# Patient Record
Sex: Male | Born: 1970 | Race: White | Hispanic: No | Marital: Married | Smoking: Never smoker
Health system: Southern US, Community
[De-identification: ages and names within clinical notes are randomized; demographics above are authoritative.]

## PROBLEM LIST (undated history)

## (undated) DIAGNOSIS — F4541 Pain disorder exclusively related to psychological factors: Secondary | ICD-10-CM

## (undated) DIAGNOSIS — Z7689 Persons encountering health services in other specified circumstances: Secondary | ICD-10-CM

## (undated) DIAGNOSIS — F419 Anxiety disorder, unspecified: Secondary | ICD-10-CM

## (undated) HISTORY — DX: Pain disorder exclusively related to psychological factors: F45.41

## (undated) HISTORY — DX: Persons encountering health services in other specified circumstances: Z76.89

## (undated) HISTORY — DX: Anxiety disorder, unspecified: F41.9

---

## 2003-12-18 HISTORY — PX: KNEE ARTHROSCOPY: SHX127

## 2004-12-17 HISTORY — PX: LUNG SURGERY: SHX703

## 2005-03-08 ENCOUNTER — Emergency Department (HOSPITAL_COMMUNITY): Admission: EM | Admit: 2005-03-08 | Discharge: 2005-03-08 | Payer: Self-pay | Admitting: *Deleted

## 2007-02-08 ENCOUNTER — Emergency Department (HOSPITAL_COMMUNITY): Admission: EM | Admit: 2007-02-08 | Discharge: 2007-02-08 | Payer: Self-pay | Admitting: Emergency Medicine

## 2007-12-05 ENCOUNTER — Ambulatory Visit: Payer: Self-pay | Admitting: Family Medicine

## 2007-12-05 DIAGNOSIS — A09 Infectious gastroenteritis and colitis, unspecified: Secondary | ICD-10-CM | POA: Insufficient documentation

## 2007-12-08 LAB — CONVERTED CEMR LAB
ALT: 53 units/L (ref 0–53)
AST: 27 units/L (ref 0–37)
CO2: 21 meq/L (ref 19–32)
Chloride: 107 meq/L (ref 96–112)
Creatinine, Ser: 0.9 mg/dL (ref 0.40–1.50)
Hemoglobin: 15.5 g/dL (ref 13.0–17.0)
Lymphocytes Relative: 32 % (ref 12–46)
MCHC: 34.8 g/dL (ref 30.0–36.0)
Monocytes Absolute: 0.7 10*3/uL (ref 0.1–1.0)
Monocytes Relative: 12 % (ref 3–12)
Neutro Abs: 2.9 10*3/uL (ref 1.7–7.7)
RBC: 4.91 M/uL (ref 4.22–5.81)
Total Bilirubin: 0.7 mg/dL (ref 0.3–1.2)
Total Protein: 7.7 g/dL (ref 6.0–8.3)

## 2008-02-04 ENCOUNTER — Ambulatory Visit: Payer: Self-pay | Admitting: Family Medicine

## 2008-02-25 ENCOUNTER — Telehealth (INDEPENDENT_AMBULATORY_CARE_PROVIDER_SITE_OTHER): Payer: Self-pay | Admitting: *Deleted

## 2008-02-26 ENCOUNTER — Encounter: Payer: Self-pay | Admitting: Family Medicine

## 2008-02-26 LAB — CONVERTED CEMR LAB
AST: 48 units/L
Albumin: 4.1 g/dL
Alkaline Phosphatase: 52 units/L
CO2: 26 meq/L
Calcium: 9 mg/dL
Creatinine, Ser: 0.9 mg/dL
Glucose, Bld: 96 mg/dL
HCT: 41.3 %
Hemoglobin: 14.8 g/dL

## 2008-02-27 ENCOUNTER — Ambulatory Visit: Payer: Self-pay | Admitting: Family Medicine

## 2008-02-27 DIAGNOSIS — L0293 Carbuncle, unspecified: Secondary | ICD-10-CM

## 2008-02-27 DIAGNOSIS — B957 Other staphylococcus as the cause of diseases classified elsewhere: Secondary | ICD-10-CM

## 2008-02-27 DIAGNOSIS — L0292 Furuncle, unspecified: Secondary | ICD-10-CM | POA: Insufficient documentation

## 2008-03-03 ENCOUNTER — Encounter: Payer: Self-pay | Admitting: Family Medicine

## 2008-03-05 ENCOUNTER — Telehealth: Payer: Self-pay | Admitting: Family Medicine

## 2008-05-13 ENCOUNTER — Telehealth: Payer: Self-pay | Admitting: Family Medicine

## 2012-04-21 ENCOUNTER — Ambulatory Visit (INDEPENDENT_AMBULATORY_CARE_PROVIDER_SITE_OTHER): Payer: BC Managed Care – PPO | Admitting: Physician Assistant

## 2012-04-21 ENCOUNTER — Encounter: Payer: Self-pay | Admitting: Physician Assistant

## 2012-04-21 VITALS — BP 128/83 | HR 90 | Ht 73.0 in | Wt 241.0 lb

## 2012-04-21 DIAGNOSIS — F3289 Other specified depressive episodes: Secondary | ICD-10-CM

## 2012-04-21 DIAGNOSIS — F329 Major depressive disorder, single episode, unspecified: Secondary | ICD-10-CM

## 2012-04-21 DIAGNOSIS — F4010 Social phobia, unspecified: Secondary | ICD-10-CM

## 2012-04-21 DIAGNOSIS — M542 Cervicalgia: Secondary | ICD-10-CM

## 2012-04-21 DIAGNOSIS — F32A Depression, unspecified: Secondary | ICD-10-CM

## 2012-04-21 DIAGNOSIS — F411 Generalized anxiety disorder: Secondary | ICD-10-CM

## 2012-04-21 MED ORDER — ALPRAZOLAM 0.5 MG PO TABS: 0.5000 mg | ORAL_TABLET | Freq: Two times a day (BID) | ORAL | Status: AC | PRN

## 2012-04-21 MED ORDER — CLONAZEPAM 0.5 MG PO TABS: 0.5000 mg | ORAL_TABLET | Freq: Two times a day (BID) | ORAL | Status: DC | PRN

## 2012-04-21 MED ORDER — CITALOPRAM HYDROBROMIDE 20 MG PO TABS: 20.0000 mg | ORAL_TABLET | Freq: Every day | ORAL | Status: DC

## 2012-04-21 NOTE — Progress Notes (Signed)
  Subjective:    Patient ID: Dale Roberts, male    DOB: 1971/05/05, 41 y.o.   MRN: [id number]  HPI Patient presents to the clinic today to establish care. PMH was reviewed. Patient is currently not on any medications. For the past 8 months patient has started to become increasingly anxious. He works 60+ hours a week, doesn't get to see his family a lot, and has a lot of trouble sleeping. He notice that crowds have made him not want to come out of his home. It took him 10 minutes to convince himself to come into office today. Sometimes when people are talking or trying to shake his hands he sees germs. He currently has a job where he has to deal with people and he finds it very hard. He is in school but will not graduate for a while. He has tried melatonin for sleep.   His neck has hurt for the past 4 months. He doesn't remember and injury. He has not tried anything to make it better. He holds a lot of stress in the upper back and shoulders.      Review of Systems     Objective:   Physical Exam  Constitutional: He is oriented to person, place, and time. He appears well-developed and well-nourished.  HENT:  Head: Normocephalic and atraumatic.  Neck: Normal range of motion. Neck supple.       NO bony tenderness. Some mild tenderness with palpation of trapizues.Strength 5/5.   Cardiovascular: Normal rate, regular rhythm, normal heart sounds and intact distal pulses.   Pulmonary/Chest: Effort normal. He has no wheezes.  Lymphadenopathy:    He has no cervical adenopathy.  Neurological: He is alert and oriented to person, place, and time.  Skin: Skin is warm and dry.  Psychiatric:       Anxious and very talkative.          Assessment & Plan:  GAD/Social anxiety/Panic attacks/Depression-PHQ-9 was 20 and GAD-7 was 16. Start celexa 20mg  1/2 tab for 7 days then increase to 1 tab at night. Will given xanax to use when having a panic attack up to twice a day. Continue exercise because that  naturally increases your feel good hormones. Recheck in 6 weeks. Make as much time with family as can. I know job is of much stress try to do things at work that help eliminate stress.  Neck pain- Offered to do x-rays today.Patient declined. Advised to start Ibuprofen for pain and stiffness. Consider massage therapy. Ice to help with pain. Could consider PT to strengthen neck muscles.

## 2012-04-21 NOTE — Patient Instructions (Addendum)
Start celexa 20mg  1/2 tab for 7 days then increase to 1 tab at night. Will given xanax to use when having a panic attack up to twice a day. Continue exercise because that naturally increases your feel good hormones. Recheck in 6 weeks. Make as much time with family as can.   Anxiety and Panic Attacks Your caregiver has informed you that you are having an anxiety or panic attack. There may be many forms of this. Most of the time these attacks come suddenly and without warning. They come at any time of day, including periods of sleep, and at any time of life. They may be strong and unexplained. Although panic attacks are very scary, they are physically harmless. Sometimes the cause of your anxiety is not known. Anxiety is a protective mechanism of the body in its fight or flight mechanism. Most of these perceived danger situations are actually nonphysical situations (such as anxiety over losing a job). CAUSES  The causes of an anxiety or panic attack are many. Panic attacks may occur in otherwise healthy people given a certain set of circumstances. There may be a genetic cause for panic attacks. Some medications may also have anxiety as a side effect. SYMPTOMS  Some of the most common feelings are:  Intense terror.   Dizziness, feeling faint.   Hot and cold flashes.   Fear of going crazy.   Feelings that nothing is real.   Sweating.   Shaking.   Chest pain or a fast heartbeat (palpitations).   Smothering, choking sensations.   Feelings of impending doom and that death is near.   Tingling of extremities, this may be from over-breathing.   Altered reality (derealization).   Being detached from yourself (depersonalization).  Several symptoms can be present to make up anxiety or panic attacks. DIAGNOSIS  The evaluation by your caregiver will depend on the type of symptoms you are experiencing. The diagnosis of anxiety or panic attack is made when no physical illness can be determined to  be a cause of the symptoms. TREATMENT  Treatment to prevent anxiety and panic attacks may include:  Avoidance of circumstances that cause anxiety.   Reassurance and relaxation.   Regular exercise.   Relaxation therapies, such as yoga.   Psychotherapy with a psychiatrist or therapist.   Avoidance of caffeine, alcohol and illegal drugs.   Prescribed medication.  SEEK IMMEDIATE MEDICAL CARE IF:   You experience panic attack symptoms that are different than your usual symptoms.   You have any worsening or concerning symptoms.  Document Released: 12/03/2005 Document Revised: 11/22/2011 Document Reviewed: 04/06/2010 [facility] Patient Information 2012 [place], [place].

## 2012-07-21 ENCOUNTER — Encounter: Payer: Self-pay | Admitting: Physician Assistant

## 2012-07-21 ENCOUNTER — Ambulatory Visit (INDEPENDENT_AMBULATORY_CARE_PROVIDER_SITE_OTHER): Payer: BC Managed Care – PPO | Admitting: Physician Assistant

## 2012-07-21 VITALS — BP 124/87 | HR 85 | Ht 73.0 in | Wt 241.0 lb

## 2012-07-21 DIAGNOSIS — F410 Panic disorder [episodic paroxysmal anxiety] without agoraphobia: Secondary | ICD-10-CM

## 2012-07-21 DIAGNOSIS — F411 Generalized anxiety disorder: Secondary | ICD-10-CM

## 2012-07-21 MED ORDER — ALPRAZOLAM 1 MG PO TABS: 1.0000 mg | ORAL_TABLET | Freq: Every evening | ORAL | Status: AC | PRN

## 2012-07-21 NOTE — Progress Notes (Signed)
  Subjective:    Patient ID: Dale Roberts, male    DOB: 1971-09-17, 41 y.o.   MRN: [id number]  HPI Patient presents to the clinic for 6 week follow up on anxiety/panic attacks. He did not start celexa because he looked at the side effect profile and did not "want to mess with brain chemistry". He has been using the xanax as needed for "attacks". He states that he uses about 2-3 times a week especially to go to sleep. He continues to be exteremly anxious. He still is working 60+ hours a week. He does think a job change will occur in October sometime. He is going to try to start exercising and has started eating a more healthy diet that is balanced. Denies any CP, palpitations, or SOB.   Review of Systems     Objective:   Physical Exam  Constitutional: He is oriented to person, place, and time. He appears well-developed and well-nourished.  HENT:  Head: Normocephalic and atraumatic.  Cardiovascular: Normal rate, regular rhythm and normal heart sounds.   Pulmonary/Chest: Effort normal and breath sounds normal.  Neurological: He is alert and oriented to person, place, and time.  Skin: Skin is warm and dry.  Psychiatric:       Anxious and talked a lot.           Assessment & Plan:  GAD/ Panic attacks- Discussed in depth about importance of treating overall anxiety and not just "attacks". I enouraged him that if we could manage overall anxiety we might be able to decrease attacks. Pt did not want to use anything other than Xanax for acute attacks. We discussed addictive nature of Xanax and how this should not be used as a crutch for all situations that increase anxiety but only for attacks and no more than 2-3 times a week. I do think that coping skills could help patient. I Encouraged him to go to counseling and learn some behavioral ways to deal with anxiety. He stated he did not have time. Encouraged him to make time for exercise because that would naturally increase his good hormone  levels. REfilled xanax.  Pt does need CPE. Encouraged him to schedule CPE in next month or so and to come fasting.

## 2012-07-21 NOTE — Patient Instructions (Addendum)
Follow up in 1-2 months for CPE. Use xanax as needed no more than once a day.

## 2012-10-10 ENCOUNTER — Ambulatory Visit (INDEPENDENT_AMBULATORY_CARE_PROVIDER_SITE_OTHER): Payer: BC Managed Care – PPO | Admitting: Family Medicine

## 2012-10-10 ENCOUNTER — Encounter: Payer: Self-pay | Admitting: Family Medicine

## 2012-10-10 VITALS — BP 140/96 | HR 75 | Temp 97.9°F | Wt 242.0 lb

## 2012-10-10 DIAGNOSIS — N451 Epididymitis: Secondary | ICD-10-CM | POA: Insufficient documentation

## 2012-10-10 DIAGNOSIS — N509 Disorder of male genital organs, unspecified: Secondary | ICD-10-CM

## 2012-10-10 DIAGNOSIS — N453 Epididymo-orchitis: Secondary | ICD-10-CM

## 2012-10-10 DIAGNOSIS — N50819 Testicular pain, unspecified: Secondary | ICD-10-CM | POA: Insufficient documentation

## 2012-10-10 LAB — POCT URINALYSIS DIPSTICK
Blood, UA: NEGATIVE
Glucose, UA: NEGATIVE
Nitrite, UA: NEGATIVE
Protein, UA: NEGATIVE
Urobilinogen, UA: 0.2
pH, UA: 7.5

## 2012-10-10 MED ORDER — DOXYCYCLINE HYCLATE 100 MG PO TABS: ORAL_TABLET | ORAL | Status: DC

## 2012-10-10 MED ORDER — LIDOCAINE HCL 1 % IJ SOLN: 250.0000 mg | Freq: Every day | INTRAMUSCULAR | Status: DC

## 2012-10-10 MED ORDER — CIPROFLOXACIN HCL 500 MG PO TABS: 500.0000 mg | ORAL_TABLET | Freq: Two times a day (BID) | ORAL | Status: AC

## 2012-10-10 NOTE — Patient Instructions (Signed)
Epididymitis Epididymitis is a swelling (inflammation) of the epididymis. The epididymis is a cord-like structure along the back part of the testicle. Epididymitis is usually, but not always, caused by infection. This is usually a sudden problem beginning with chills, fever and pain behind the scrotum and in the testicle. There may be swelling and redness of the testicle. DIAGNOSIS  Physical examination will reveal a tender, swollen epididymis. Sometimes, cultures are obtained from the urine or from prostate secretions to help find out if there is an infection or if the cause is a different problem. Sometimes, blood work is performed to see if your white blood cell count is elevated and if a germ (bacterial) or viral infection is present. Using this knowledge, an appropriate medicine which kills germs (antibiotic) can be chosen by your caregiver. A viral infection causing epididymitis will most often go away (resolve) without treatment. HOME CARE INSTRUCTIONS   Hot sitz baths for 20 minutes, 4 times per day, may help relieve pain.  Only take over-the-counter or prescription medicines for pain, discomfort or fever as directed by your caregiver.  Take all medicines, including antibiotics, as directed. Take the antibiotics for the full prescribed length of time even if you are feeling better.  It is very important to keep all follow-up appointments. SEEK IMMEDIATE MEDICAL CARE IF:   You have a fever.  You have pain not relieved with medicines.  You have any worsening of your problems.  Your pain seems to come and go.  You develop pain, redness, and swelling in the scrotum and surrounding areas. MAKE SURE YOU:   Understand these instructions.  Will watch your condition.  Will get help right away if you are not doing well or get worse. Document Released: 11/30/2000 Document Revised: 02/25/2012 Document Reviewed: 10/20/2009 [employer] Patient Information 2013 [employer].  

## 2012-10-10 NOTE — Progress Notes (Signed)
CC: Dale Roberts is a 41 y.o. male is here for Testicle Pain   Subjective: HPI:  Patient describes 2-3 weeks of a waxing and waning "tightness" in his right testicle it is not influenced by position or activities. He grew concerned there is a past day severity has intensified and is a small degree of radiation proximally into the groin. The discomfort seems to come and go about every 30-60 minutes, it can be 10 out of 10 pain but also will have periods of complete resolution of pain. When the pain comes on these past 24 hours and associated with nausea. He's never had this before. There's been no interventions as of yet. He denies any recent trauma to the testicle. He denies any swelling or architectural changes to the naked eye. He denies any penile discharge or lesions. He states it does feel somewhat odd when urinating but he has trouble describing this. He is sexually active with his wife and does not believe that she has any sexual transmitted infections. He denies fevers, abdominal pain, GI disturbance, bloody stools, Kerlix stools, history of hernias, history of urologic surgeries, pain in the rectum, nor pain exacerbated by sitting.   Review Of Systems Outlined In HPI  Past Medical History  Diagnosis Date  . Anxiety   . Stress headaches   . Sleep concern      No family history on file.   History  Substance Use Topics  . Smoking status: Never Smoker   . Smokeless tobacco: Not on file  . Alcohol Use: 2.5 oz/week    5 drink(s) per week     Objective: Filed Vitals:   10/10/12 0908  BP: 140/96  Pulse: 75  Temp: 97.9 F (36.6 C)    General: Alert and Oriented, No Acute Distress Lungs:Comfortable work of breathing. Cardiac: Regular rate and rhythm. Abdomen: Soft nontender, no inguinal hernia GU: Penis has a grossly normal appearance without lesions, there is no apparent swelling either testicle, the right testicle is exquisitely tender in the posterior aspect, his pain is  localized to the epididymis, cremasteric reflexes present and there is no pain with palpation of the spermatic cord or surrounding vessels. The testicle has a vertical lie  Assessment & Plan: [name] was seen today for testicle pain.  Diagnoses and associated orders for this visit:  Testicular pain - POCT urinalysis dipstick - GC/chlamydia probe amp, urine - Urine culture  Epididymitis, right - cefTRIAXone (ROCEPHIN) 250 mg in lidocaine (XYLOCAINE) 1 % IM only syringe; Inject 1 mL (250 mg total) into the muscle daily at 6 (six) AM. - ciprofloxacin (CIPRO) 500 MG tablet; Take 1 tablet (500 mg total) by mouth 2 (two) times daily. - doxycycline (VIBRA-TABS) 100 MG tablet; One by mouth twice a day for ten days.    Suspect epididymitis, we'll treat empirically for gonorrhea and Chlamydia testing sent out for confirmation and for counseling of his partner, given his age we will also cover possible enteric organisms with Cipro. Urinalysis is unremarkable, culture has been sent.Signs and symptoms requring emergent/urgent reevaluation were discussed with the patient. I've asked him to avoid sexual intercourse until results are back.   Return if symptoms worsen or fail to improve.

## 2012-10-13 ENCOUNTER — Other Ambulatory Visit: Payer: Self-pay | Admitting: Physician Assistant

## 2012-10-13 ENCOUNTER — Other Ambulatory Visit: Payer: BC Managed Care – PPO

## 2012-10-13 ENCOUNTER — Encounter: Payer: Self-pay | Admitting: Family Medicine

## 2012-10-13 ENCOUNTER — Other Ambulatory Visit: Payer: Self-pay | Admitting: Family Medicine

## 2012-10-13 ENCOUNTER — Ambulatory Visit (HOSPITAL_BASED_OUTPATIENT_CLINIC_OR_DEPARTMENT_OTHER)
Admission: RE | Admit: 2012-10-13 | Discharge: 2012-10-13 | Disposition: A | Discharge status: Home / Self Care | Payer: BC Managed Care – PPO | Source: Ambulatory Visit | Attending: Family Medicine | Admitting: Family Medicine

## 2012-10-13 ENCOUNTER — Ambulatory Visit (INDEPENDENT_AMBULATORY_CARE_PROVIDER_SITE_OTHER): Payer: BC Managed Care – PPO | Admitting: Family Medicine

## 2012-10-13 ENCOUNTER — Other Ambulatory Visit (HOSPITAL_BASED_OUTPATIENT_CLINIC_OR_DEPARTMENT_OTHER): Payer: BC Managed Care – PPO

## 2012-10-13 ENCOUNTER — Telehealth: Payer: Self-pay | Admitting: Family Medicine

## 2012-10-13 VITALS — BP 121/87 | HR 84 | Temp 97.8°F | Ht 72.0 in | Wt 241.0 lb

## 2012-10-13 DIAGNOSIS — N50811 Right testicular pain: Secondary | ICD-10-CM

## 2012-10-13 DIAGNOSIS — N509 Disorder of male genital organs, unspecified: Secondary | ICD-10-CM

## 2012-10-13 DIAGNOSIS — R319 Hematuria, unspecified: Secondary | ICD-10-CM

## 2012-10-13 DIAGNOSIS — R079 Chest pain, unspecified: Secondary | ICD-10-CM

## 2012-10-13 DIAGNOSIS — N50819 Testicular pain, unspecified: Secondary | ICD-10-CM

## 2012-10-13 DIAGNOSIS — N433 Hydrocele, unspecified: Secondary | ICD-10-CM | POA: Insufficient documentation

## 2012-10-13 DIAGNOSIS — R071 Chest pain on breathing: Secondary | ICD-10-CM | POA: Insufficient documentation

## 2012-10-13 DIAGNOSIS — K5900 Constipation, unspecified: Secondary | ICD-10-CM | POA: Insufficient documentation

## 2012-10-13 LAB — COMPLETE METABOLIC PANEL WITH GFR
ALT: 65 U/L — ABNORMAL HIGH (ref 0–53)
AST: 39 U/L — ABNORMAL HIGH (ref 0–37)
Albumin: 4.9 g/dL (ref 3.5–5.2)
Alkaline Phosphatase: 50 U/L (ref 39–117)
BUN: 14 mg/dL (ref 6–23)
CO2: 27 mEq/L (ref 19–32)
Calcium: 9.6 mg/dL (ref 8.4–10.5)
Chloride: 103 mEq/L (ref 96–112)
Creat: 0.96 mg/dL (ref 0.50–1.35)
GFR, Est African American: >89 mL/min
GFR, Est Non African American: >89 mL/min
Glucose, Bld: 117 mg/dL — ABNORMAL HIGH (ref 70–99)
Potassium: 4.2 mEq/L (ref 3.5–5.3)
Sodium: 139 mEq/L (ref 135–145)
Total Bilirubin: 0.6 mg/dL (ref 0.3–1.2)
Total Protein: 7.5 g/dL (ref 6.0–8.3)

## 2012-10-13 LAB — POCT URINALYSIS DIPSTICK
Bilirubin, UA: NEGATIVE
Glucose, UA: NEGATIVE
Ketones, UA: NEGATIVE
Leukocytes, UA: NEGATIVE
Nitrite, UA: NEGATIVE
Protein, UA: NEGATIVE
Spec Grav, UA: 1.025
Urobilinogen, UA: 0.2
pH, UA: 5.5

## 2012-10-13 LAB — CBC WITH DIFFERENTIAL/PLATELET
Basophils Absolute: 0 10*3/uL (ref 0.0–0.1)
Eosinophils Relative: 2 % (ref 0–5)
Hemoglobin: 15 g/dL (ref 13.0–17.0)
Lymphocytes Relative: 33 % (ref 12–46)
Lymphs Abs: 1.6 10*3/uL (ref 0.7–4.0)
MCH: 31 pg (ref 26.0–34.0)
Neutrophils Relative %: 57 % (ref 43–77)
RBC: 4.84 MIL/uL (ref 4.22–5.81)

## 2012-10-13 MED ORDER — ALPRAZOLAM 1 MG PO TABS: 1.0000 mg | ORAL_TABLET | Freq: Every evening | ORAL | Status: DC | PRN

## 2012-10-13 NOTE — Progress Notes (Signed)
CC: Dale Roberts is a 41 y.o. male is here for Testicle Pain   Subjective: HPI:  Patient returns with worsening right testicle pain. Is now associated with episodes of confusion, nausea, right abdominal pain, suprapubic pain, and posterior lateral pain in his chest wall. He complains of subjective fevers and chills. He states after receiving Rocephin and starting Cipro and doxycycline he felt somewhat better Friday afternoon/evening but pretty much laid in bed for the majority of Saturday and Sunday. Urinalysis on Friday was unremarkable and I'm still waiting for urine culture and gonorrhea/Chlamydia. Patient denies any change in odor color or consistency of his urine, and denies any change in his bowel habits he continues to have one pass bowel movement a day without blood or tar-like appearance. His discomfort is described as a pressure and grip like sensation in his right testicle that radiates up in the abdomen typically 10 out of 10 in severity the often receives to 0/10 without any particular intervention, these episodes last about half an hour and come on every hour or so. He denies any testicular swelling, change in the skin was testicles, penile discharge, penile pain, flank pain, blood in the urine. He is concerned about his posterior lateral chest wall pain because it feels identical to that when he had an episode of what sounds like a MRSA empyema in his left lung back in 2008.    Review Of Systems Outlined In HPI  Past Medical History  Diagnosis Date  . Anxiety   . Stress headaches   . Sleep concern      No family history on file.   History  Substance Use Topics  . Smoking status: Never Smoker   . Smokeless tobacco: Not on file  . Alcohol Use: 2.5 oz/week    5 drink(s) per week     Objective: Filed Vitals:   10/13/12 1123  BP: 121/87  Pulse: 84  Temp: 97.8 F (36.6 C)    General: Alert and Oriented, No Acute Distress HEENT: Pupils equal, round, reactive to  light. Conjunctivae clear.  Moist mucous membranes, pharynx without inflammation nor lesions.  Lungs: Clear to auscultation bilaterally, no wheezing/ronchi/rales.  Comfortable work of breathing.  decreased breath sounds on the left side  Back: No CVA tenderness to percussion Cardiac: Regular rate and rhythm. Normal S1/S2.  No murmurs, rubs, nor gallops.   Abdomen: Normal bowel sounds, soft and non tender without palpable masses.No right lower quadrant tenderness Genitourinary: Unremarkable penis, left testicle is nontender, right testicle is tender on the posterior aspect, is lying in a vertical orientation, cremasteric reflexes present, no inguinal hernia palpated, Skin: Warm and dry.  Assessment & Plan: Dale Roberts was seen today for testicle pain.  Diagnoses and associated orders for this visit:  Testicular pain - US Scrotum; Future - CBC with Differential - Lactic acid, plasma - POCT urinalysis dipstick  Chest pain - DG Chest 2 View; Future - COMPLETE METABOLIC PANEL WITH GFR     Ultrasound of the scrotum this afternoon to assess for blood flow and rule in or out possible infection. Due to trace blood and urine I can to get a CT scan of abdomen and pelvis to rule out nephrolithiasis. CBC the CBC is a white count, lactic acid to rule out bowel ischemia, complete metabolic panel to rule out renal dysfunction or hepatic dysfunction. Ultimate plan will be discussed with the patient once results are available.

## 2012-10-13 NOTE — Telephone Encounter (Signed)
Sue Lush, Will you please let mr bruss know that his chest xray, ct scan, and ultrasound were all normal. The chest xray is reported as unchanged dating back to a 2008 film. I'm waiting for all the blood work to return but there does not appear to be any active infection based on what's back already.  I'd like him to continue taking the cipro and doxycycline though.  I'm going to schedule him an urgent urology appointment, but if he's feeling worse overnight or into tomorrow I'd like him to go to an emergency room since I know he's already in quite a bit of discomfort.  I've placed the Xanax rx he requested on your desk, can you please send it in to whatever pharmacy he'd like.

## 2012-10-13 NOTE — Telephone Encounter (Signed)
Pt walk-in advised that he has not been able to sleep and needs something called in today please. He saw another Dr. Here in our office and in his chart he was prescribed something for sleep and it has been a little while since been filled and req to have that particular med filled and sent to [facility] in [place]. Thanks!

## 2012-10-13 NOTE — Telephone Encounter (Signed)
noted 

## 2012-10-13 NOTE — Telephone Encounter (Signed)
Pt notified and rx called in; pt canceled appointment with urology for tomorrow. He states he cannot miss anymore days of work. Pt understands to go to ER if sxs worsen

## 2012-10-15 LAB — GC/CHLAMYDIA PROBE AMP, URINE: GC Probe Amp, Urine: NEGATIVE

## 2012-10-16 LAB — URINE CULTURE
Colony Count: 7000
Organism ID, Bacteria: NO GROWTH

## 2013-02-03 ENCOUNTER — Ambulatory Visit (INDEPENDENT_AMBULATORY_CARE_PROVIDER_SITE_OTHER): Payer: BC Managed Care – PPO | Admitting: Family Medicine

## 2013-02-03 ENCOUNTER — Encounter: Payer: Self-pay | Admitting: Family Medicine

## 2013-02-03 VITALS — BP 130/87 | HR 87 | Wt 243.0 lb

## 2013-02-03 DIAGNOSIS — F411 Generalized anxiety disorder: Secondary | ICD-10-CM

## 2013-02-03 DIAGNOSIS — L0390 Cellulitis, unspecified: Secondary | ICD-10-CM

## 2013-02-03 MED ORDER — SULFAMETHOXAZOLE-TRIMETHOPRIM 800-160 MG PO TABS: ORAL_TABLET | ORAL | Status: AC

## 2013-02-03 MED ORDER — ALPRAZOLAM 1 MG PO TABS: 1.0000 mg | ORAL_TABLET | Freq: Two times a day (BID) | ORAL | Status: DC | PRN

## 2013-02-03 NOTE — Progress Notes (Signed)
CC: Dale Roberts is a 42 y.o. male is here for Testicle Pain   Subjective: HPI:  Patient presents for concerns of abdominal pain. He describes a burning sensation at his umbilicus that radiates towards his testicles but not into his testicles. This is been present for 3 days and is working on a daily basis. It is mild without manipulation but worse when he puts his finger in his umbilicus. Interestingly he noticed that this is the exact presentation of discomfort that led to testicular pain last fall which was overall unremarkable on imaging and labs but did respond to antibiotics.  Nothing else makes pain better or worse other than that above. He denies trauma or manipulation of his umbilicus recently. He has a history of a MRSA pneumonia. Pain above is present 24 hours a day but does not interfere with sleep. He denies fevers, chills, nausea, GI disturbance, penile discharge, lesions of the penis, testicular swelling, nor pain with manipulation of testicles.  Patient states that he feels that his 30 pills of Xanax does not last him home on. He is breaking his tablets to have half a milligram in the morning and often has a milligram at night. There are nights and days he takes none whatsoever. He is asking if we can alter his regimen. He states he's taking this for anxiety that he feels is worsening do to stress at work. He denies stress at home or with peers. He denies depression, mental disturbance, tremor, unintentional weight loss, nor paranoia    Review Of Systems Outlined In HPI  Past Medical History  Diagnosis Date  . Anxiety   . Stress headaches   . Sleep concern      No family history on file.   History  Substance Use Topics  . Smoking status: Never Smoker   . Smokeless tobacco: Not on file  . Alcohol Use: 2.5 oz/week    5 drink(s) per week     Objective: Filed Vitals:   02/03/13 1507  BP: 130/87  Pulse: 87    General: Alert and Oriented, No Acute Distress HEENT:  Pupils equal, round, reactive to light. Conjunctivae clear.  Moist mucous membranes Lungs: Clear to auscultation bilaterally, no wheezing/ronchi/rales.  Comfortable work of breathing. Good air movement. Cardiac: Regular rate and rhythm. Normal S1/S2.  No murmurs, rubs, nor gallops.   Abdomen: Normal bowel sounds, soft and non tender without palpable masses. Extremities: No peripheral edema.  Strong peripheral pulses.  Mental Status: No depression, nor agitation. Moderate anxiety Skin: Warm and dry. There are 3 small pustules with surrounding erythema and in his navel without fluctuance or induration   Assessment & Plan: Dale Roberts was seen today for testicle pain.  Diagnoses and associated orders for this visit:  Cellulitis  Anxiety state, unspecified  Other Orders - sulfamethoxazole-trimethoprim (SEPTRA DS) 800-160 MG per tablet; One by mouth twice a day for ten days. - ALPRAZolam (XANAX) 1 MG tablet; Take 1 tablet (1 mg total) by mouth 2 (two) times daily as needed for anxiety.    Cellulitis: Start Bactrim for MRSA coverage. Likely that pain may be referring towards his testicles. Discussed need for immediate reevaluation if testicular pain develops Anxiety: Uncontrolled, discussed using 1-1.5 mg of Ativan daily split over twice a day as needed.  Return or at least call 2 days if symptoms are not improving  25 minutes spent face-to-face during visit today of which at least 50% was counseling or coordinating care regarding cellulitis and anxiety.  Return if symptoms worsen or fail to improve.

## 2013-03-26 ENCOUNTER — Telehealth: Payer: Self-pay | Admitting: *Deleted

## 2013-03-26 NOTE — Telephone Encounter (Signed)
Pt came into the office today asking for a refill of his xanax. I told him that it was too early to fill and it would not be able to be filled until 4-18. 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 Also pt really needs a f/u appt because per last note anxiety is uncontrolled. He wanted me to ask you to see if you would fill it anyway and if you wouldn't fill it until the 18th if it could be sent to a pharmacy wherever he was going to be at time he was on the road

## 2013-03-27 MED ORDER — ALPRAZOLAM 1 MG PO TABS: 1.0000 mg | ORAL_TABLET | Freq: Two times a day (BID) | ORAL | Status: AC | PRN

## 2013-03-27 NOTE — Telephone Encounter (Signed)
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 I'd like him to follow up before future refills.

## 2013-03-27 NOTE — Telephone Encounter (Addendum)
Called and left message on voice mail to call and let me know where I need to send rx and to f/u for future refills

## 2013-03-30 ENCOUNTER — Telehealth: Payer: Self-pay | Admitting: Family Medicine

## 2013-03-30 NOTE — Telephone Encounter (Signed)
Refill request for alprazolam already taken care of on the 10th.

## 2013-04-06 ENCOUNTER — Encounter: Payer: Self-pay | Admitting: *Deleted

## 2015-09-05 ENCOUNTER — Emergency Department
Admission: EM | Admit: 2015-09-05 | Discharge: 2015-09-05 | Disposition: A | Discharge status: Home / Self Care | Payer: Self-pay | Source: Home / Self Care | Attending: Family Medicine | Admitting: Family Medicine

## 2015-09-05 ENCOUNTER — Encounter: Payer: Self-pay | Admitting: Emergency Medicine

## 2015-09-05 DIAGNOSIS — Z8614 Personal history of Methicillin resistant Staphylococcus aureus infection: Secondary | ICD-10-CM

## 2015-09-05 DIAGNOSIS — L739 Follicular disorder, unspecified: Secondary | ICD-10-CM

## 2015-09-05 DIAGNOSIS — R21 Rash and other nonspecific skin eruption: Secondary | ICD-10-CM

## 2015-09-05 MED ORDER — DOXYCYCLINE HYCLATE 100 MG PO CAPS: 100.0000 mg | ORAL_CAPSULE | Freq: Two times a day (BID) | ORAL | Status: DC

## 2015-09-05 NOTE — ED Provider Notes (Signed)
CSN: [id number]     Arrival date & time 09/05/15  1191 History   First MD Initiated Contact with Patient 09/05/15 [phone]     Chief Complaint  Patient presents with  . Rash   (Consider location/radiation/quality/duration/timing/severity/associated sxs/prior Treatment) HPI  Pt is a 44yo male with hx of MRSA, presenting to [facility] with c/o painful rash on abdomen and arms.  Pt states he shaved his trunk and trimmed the hair on his arms about 1.5 weeks ago, then noticed a small area of red bumps after working out at [employer]  Yesterday, pt noticed even more red painful bumps around his waistband and arms yesterday as he states he was doing a lot of sweating from his workout.  Pt concerned its another MRSA infection.  Pain is aching and sore, mild to moderate in severity. Minimal itching. Denies fever or chills but does report nausea, no vomiting. No sick contacts or recent travel. Denies new soaps, lotions, or medications.   Past Medical History  Diagnosis Date  . Anxiety   . Stress headaches   . Sleep concern    Past Surgical History  Procedure Laterality Date  . Lung surgery  2006    MRSA removed  . Knee arthroscopy  2005   No family history on file. Social History  Substance Use Topics  . Smoking status: Never Smoker   . Smokeless tobacco: None  . Alcohol Use: 2.5 oz/week    5 drink(s) per week    Review of Systems  Constitutional: Negative for fever and chills.  Respiratory: Negative for cough and shortness of breath.   Gastrointestinal: Positive for nausea. Negative for vomiting, diarrhea and constipation.  Musculoskeletal: Negative for myalgias, arthralgias, neck pain and neck stiffness.  Skin: Positive for rash and wound.    Allergies  Review of patient's allergies indicates no known allergies.  Home Medications   Prior to Admission medications   Medication Sig Start Date End Date Taking? Authorizing Provider  doxycycline (VIBRAMYCIN) 100 MG capsule Take 1 capsule (100 mg  total) by mouth 2 (two) times daily. One po bid x 7 days 09/05/15   Junius Finner, PA-C   Meds Ordered and Administered this Visit  Medications - No data to display  BP 109/76 mmHg  Pulse 78  Temp(Src) 98.4 F (36.9 C) (Oral)  Ht 6' (1.829 m)  Wt 232 lb (105.235 kg)  BMI 31.46 kg/m2  SpO2 97% No data found.   Physical Exam  Constitutional: He is oriented to person, place, and time. He appears well-developed and well-nourished.  HENT:  Head: Normocephalic and atraumatic.  Eyes: EOM are normal.  Neck: Normal range of motion.  Cardiovascular: Normal rate.   Pulmonary/Chest: Effort normal. No respiratory distress.  Abdominal: Soft. He exhibits no distension. There is no tenderness.  Musculoskeletal: Normal range of motion.  Neurological: He is alert and oriented to person, place, and time.  Skin: Skin is warm and dry. Rash noted. There is erythema.  Scattered erythematous pustular rash on trunk, worst around waist band. Similar pustules on Left and right forearms. No active drainage or bleeding. Mildly tender. No induration or fluctuance.  Psychiatric: He has a normal mood and affect. His behavior is normal.  Nursing note and vitals reviewed.   ED Course  Procedures (including critical care time)  Labs Review Labs Reviewed - No data to display  Imaging Review No results found.   MDM   1. Rash   2. Folliculitis   3. History of MRSA  infection     Diffuse erythematous pustular rash on trunk and bilateral forearms c/w folliculitis. No areas requiring I&D at this time.  Rx: doxycycline  BID x 7 days. Home care instructions provided. Advised to f/u in 3-4 days with PCP if not improving, sooner if worsening. Patient verbalized understanding and agreement with treatment plan.     Junius Finner, PA-C 09/05/15 1238

## 2015-09-05 NOTE — ED Notes (Signed)
Red painful rash on chest and arms after shaving and working out at the gym. Hx of staph infection.

## 2015-09-21 ENCOUNTER — Telehealth: Payer: Self-pay | Admitting: Emergency Medicine

## 2015-09-30 ENCOUNTER — Telehealth: Payer: Self-pay | Admitting: *Deleted

## 2016-01-27 ENCOUNTER — Encounter: Payer: Self-pay | Admitting: *Deleted

## 2016-01-27 ENCOUNTER — Emergency Department
Admission: EM | Admit: 2016-01-27 | Discharge: 2016-01-27 | Disposition: A | Discharge status: Home / Self Care | Payer: Self-pay | Source: Home / Self Care | Attending: Family Medicine | Admitting: Family Medicine

## 2016-01-27 DIAGNOSIS — L02211 Cutaneous abscess of abdominal wall: Secondary | ICD-10-CM

## 2016-01-27 DIAGNOSIS — L739 Follicular disorder, unspecified: Secondary | ICD-10-CM

## 2016-01-27 DIAGNOSIS — Z8614 Personal history of Methicillin resistant Staphylococcus aureus infection: Secondary | ICD-10-CM

## 2016-01-27 MED ORDER — CLINDAMYCIN HCL 150 MG PO CAPS: 300.0000 mg | ORAL_CAPSULE | Freq: Three times a day (TID) | ORAL | Status: DC

## 2016-01-27 MED ORDER — IBUPROFEN 600 MG PO TABS
600.0000 mg | ORAL_TABLET | Freq: Once | ORAL | Status: AC
  Administered 2016-01-27: 600 mg via ORAL

## 2016-01-27 NOTE — ED Provider Notes (Signed)
CSN: [id number]     Arrival date & time 01/27/16  0909 History   First MD Initiated Contact with Patient 01/27/16 0930     Chief Complaint  Patient presents with  . Rash   (Consider location/radiation/quality/duration/timing/severity/associated sxs/prior Treatment) HPI Pt is a 45yo male presenting to [facility] with c/o 2-3 weeks of painful erythematous rash with pustules on trunk.  No active drainage or bleeding.  Pain is 9/10, aching and burning, worse with palpation.  He also reports "flu-like" symptoms of fatigue and body aches with a fever of 101*F last night.  He reports hx of folliculitis and MRSA.  He has been treated with doxycycline and bactrim in the last few months due to similar rashes but states rash reappears shortly after he stops the antibiotics. He notes he has been able to keep the rash "at bay" by using antibiotic soap and even bleach but this time rash will not improve.  Pt is concerned as he has a hx of pneumonia from the MRSA and had multiple internal abscesses. He does not feel he is that sick yet but hopes to prevent things from getting that bad.    Past Medical History  Diagnosis Date  . Anxiety   . Stress headaches   . Sleep concern    Past Surgical History  Procedure Laterality Date  . Lung surgery  2006    MRSA removed  . Knee arthroscopy  2005   History reviewed. No pertinent family history. Social History  Substance Use Topics  . Smoking status: Never Smoker   . Smokeless tobacco: Never Used  . Alcohol Use: 1.2 oz/week    2 Standard drinks or equivalent per week    Review of Systems  Constitutional: Positive for fever, chills and fatigue.  HENT: Negative for congestion, ear pain, sore throat, trouble swallowing and voice change.   Respiratory: Negative for cough and shortness of breath.   Cardiovascular: Negative for chest pain and palpitations.  Gastrointestinal: Positive for nausea and vomiting. Negative for abdominal pain and diarrhea.    Musculoskeletal: Positive for myalgias, back pain and arthralgias.  Skin: Positive for color change, rash and wound. Negative for pallor.    Allergies  Review of patient's allergies indicates no known allergies.  Home Medications   Prior to Admission medications   Medication Sig Start Date End Date Taking? Authorizing Provider  clindamycin (CLEOCIN) 150 MG capsule Take 2 capsules (300 mg total) by mouth 3 (three) times daily. For 7 days 01/27/16   Junius Finner, PA-C   Meds Ordered and Administered this Visit   Medications  ibuprofen (ADVIL,MOTRIN) tablet 600 mg (600 mg Oral Given 01/27/16 0947)    BP 124/85 mmHg  Pulse 94  Temp(Src) 98.1 F (36.7 C) (Oral)  Resp 16  Wt 238 lb (107.956 kg)  SpO2 96% No data found.   Physical Exam  Constitutional: He appears well-developed and well-nourished.  HENT:  Head: Normocephalic and atraumatic.  Eyes: Conjunctivae are normal. No scleral icterus.  Neck: Normal range of motion. Neck supple.  Cardiovascular: Normal rate, regular rhythm and normal heart sounds.   Pulmonary/Chest: Effort normal and breath sounds normal. No respiratory distress. He has no wheezes. He has no rales. He exhibits no tenderness.  Abdominal: Soft. He exhibits no distension and no mass. There is tenderness ( to the skin (see skin exam)). There is no rebound and no guarding.  Musculoskeletal: Normal range of motion.  Neurological: He is alert.  Skin: Skin is warm and dry. Rash  noted. Rash is pustular. There is erythema.     Diffuse erythematous pustules on abdomen. No active drainage or bleeding. No lesions on arms, legs, back, or face.  Lesions are tender to touch.  Nursing note and vitals reviewed.   ED Course  Procedures (including critical care time)  Labs Review Labs Reviewed - No data to display  Imaging Review No results found.     MDM   1. Folliculitis   2. Cutaneous abscess of abdominal wall   3. History of MRSA infection    Pt with  hx of MRSA and folliculitis presenting to [facility] with fever Tmax 101*F and painful, erythematous, pustular rash on abdomen for 2-3 weeks.    Per medical records he has been on bactrim and doxycycline in the past with only temporary relief. Will tx Clindamycin  TID for 7 days. Encouraged to f/u with PCP for recheck of symptoms. Patient verbalized understanding and agreement with treatment plan.     Junius Finner, PA-C 01/27/16 1109

## 2016-01-27 NOTE — Discharge Instructions (Signed)
You may take 400-600mg  Ibuprofen (Motrin) every 6-8 hours for fever and pain  Alternate with Tylenol  You may take 500mg  Tylenol every 4-6 hours as needed for fever and pain  Follow-up with your primary care provider next week for recheck of symptoms if not improving.  Be sure to drink plenty of fluids and rest, at least 8hrs of sleep a night, preferably more while you are sick. Return urgent care or go to closest ER if you cannot keep down fluids/signs of dehydration, fever not reducing with Tylenol, difficulty breathing/wheezing, stiff neck, worsening condition, or other concerns (see below)  Please take antibiotics as prescribed and be sure to complete entire course even if you start to feel better to ensure infection does not come back.  Folliculitis Folliculitis is redness, soreness, and swelling (inflammation) of the hair follicles. This condition can occur anywhere on the body. People with weakened immune systems, diabetes, or obesity have a greater risk of getting folliculitis. CAUSES  Bacterial infection. This is the most common cause.  Fungal infection.  Viral infection.  Contact with certain chemicals, especially oils and tars. Long-term folliculitis can result from bacteria that live in the nostrils. The bacteria may trigger multiple outbreaks of folliculitis over time. SYMPTOMS Folliculitis most commonly occurs on the scalp, thighs, legs, back, buttocks, and areas where hair is shaved frequently. An early sign of folliculitis is a small, white or yellow, pus-filled, itchy lesion (pustule). These lesions appear on a red, inflamed follicle. They are usually less than 0.2 inches (5 mm) wide. When there is an infection of the follicle that goes deeper, it becomes a boil or furuncle. A group of closely packed boils creates a larger lesion (carbuncle). Carbuncles tend to occur in hairy, sweaty areas of the body. DIAGNOSIS  Your caregiver can usually tell what is wrong by doing a  physical exam. A sample may be taken from one of the lesions and tested in a lab. This can help determine what is causing your folliculitis. TREATMENT  Treatment may include:  Applying warm compresses to the affected areas.  Taking antibiotic medicines orally or applying them to the skin.  Draining the lesions if they contain a large amount of pus or fluid.  Laser hair removal for cases of long-lasting folliculitis. This helps to prevent regrowth of the hair. HOME CARE INSTRUCTIONS  Apply warm compresses to the affected areas as directed by your caregiver.  If antibiotics are prescribed, take them as directed. Finish them even if you start to feel better.  You may take over-the-counter medicines to relieve itching.  Do not shave irritated skin.  Follow up with your caregiver as directed. SEEK IMMEDIATE MEDICAL CARE IF:   You have increasing redness, swelling, or pain in the affected area.  You have a fever. MAKE SURE YOU:  Understand these instructions.  Will watch your condition.  Will get help right away if you are not doing well or get worse.   This information is not intended to replace advice given to you by your health care provider. Make sure you discuss any questions you have with your health care provider.   Document Released: 02/11/2002 Document Revised: 12/24/2014 Document Reviewed: 03/04/2012 Elsevier Interactive Patient Education 2016 Elsevier Inc.  Abscess An abscess (boil or furuncle) is an infected area on or under the skin. This area is filled with yellowish-white fluid (pus) and other material (debris). HOME CARE   Only take medicines as told by your doctor.  If you were given antibiotic  medicine, take it as directed. Finish the medicine even if you start to feel better.  If gauze is used, follow your doctor's directions for changing the gauze.  To avoid spreading the infection:  Keep your abscess covered with a bandage.  Wash your hands  well.  Do not share personal care items, towels, or whirlpools with others.  Avoid skin contact with others.  Keep your skin and clothes clean around the abscess.  Keep all doctor visits as told. GET HELP RIGHT AWAY IF:   You have more pain, puffiness (swelling), or redness in the wound site.  You have more fluid or blood coming from the wound site.  You have muscle aches, chills, or you feel sick.  You have a fever. MAKE SURE YOU:   Understand these instructions.  Will watch your condition.  Will get help right away if you are not doing well or get worse.   This information is not intended to replace advice given to you by your health care provider. Make sure you discuss any questions you have with your health care provider.   Document Released: 05/21/2008 Document Revised: 06/03/2012 Document Reviewed: 02/16/2012 Elsevier Interactive Patient Education 2016 [employer].  MRSA FAQs What is MRSA? Staphylococcus aureus (pronounced staff-ill-oh-KOK-us AW-ree-us), or "Staph" is a very common germ that about 1 out of every 3 people have on their skin or in their nose. This germ does not cause any problems for most people who have it on their skin. But sometimes it can cause serious infections such as skin or wound infections, pneumonia, or infections of the blood.  Antibiotics are given to kill Staph germs when they cause infections. Some Staph are resistant, meaning they cannot be killed by some antibiotics. "Methicillin-resistant Staphylococcus aureus" or "MRSA" is a type of Staph that is resistant to some of the antibiotics that are often used to treat Staph infections. Who is most likely to get an MRSA infection? In the hospital, people who are more likely to get an MRSA infection are people who:  have other health conditions making them sick  have been in the hospital or a nursing home  have been treated with antibiotics. People who are healthy and who have not been in  the hospital or a nursing home can also get MRSA infections. These infections usually involve the skin. More information about this type of MRSA infection, known as "community-associated MRSA" infection, is available from the Centers for Disease Control and Prevention (CDC). ReportNation.uy How do I get an MRSA infection? People who have MRSA germs on their skin or who are infected with MRSA may be able to spread the germ to other people. MRSA can be passed on to bed linens, bed rails, bathroom fixtures, and medical equipment. It can spread to other people on contaminated equipment and on the hands of doctors, nurses, other healthcare providers and visitors. Can MRSA infections be treated? Yes, there are antibiotics that can kill MRSA germs. Some patients with MRSA abscesses may need surgery to drain the infection. Your healthcare provider will determine which treatments are best for you. What are some of the things that hospitals are doing to prevent MRSA infections? To prevent MRSA infections, doctors, nurses and other healthcare providers:  Clean their hands with soap and water or an alcohol-based hand rub before and after caring for every patient.  Carefully clean hospital rooms and medical equipment.  Use Contact Precautions when caring for patients with MRSA. Contact Precautions mean:  Whenever possible, patients  with MRSA will have a single room or will share a room only with someone else who also has MRSA.  Healthcare providers will put on gloves and wear a gown over their clothing while taking care of patients with MRSA.  Visitors may also be asked to wear a gown and gloves.  When leaving the room, hospital providers and visitors remove their gown and gloves and clean their hands.  Patients on Contact Precautions are asked to stay in their hospital rooms as much as possible. They should not go to common areas, such as the gift shop or cafeteria. They may go to other areas of  the hospital for treatments and tests.  May test some patients to see if they have MRSA on their skin. This test involves rubbing a cotton-tipped swab in the patient's nostrils or on the skin. What can I do to help prevent MRSA infections? In the hospital  Make sure that all doctors, nurses, and other healthcare providers clean their hands with soap and water or an alcohol-based hand rub before and after caring for you. If you do not see your providers clean their hands, please ask them to do so. When you go home  If you have wounds or an intravascular device (such as a catheter or dialysis port) make sure that you know how to take care of them. Can my friends and family get MRSA when they visit me? The chance of getting MRSA while visiting a person who has MRSA is very low. To decrease the chance of getting MRSA your family and friends should:  Clean their hands before they enter your room and when they leave.  Ask a healthcare provider if they need to wear protective gowns and gloves when they visit you. What do I need to do when I go home from the hospital? To prevent another MRSA infection and to prevent spreading MRSA to others:  Keep taking any antibiotics prescribed by your doctor. Don't take half-doses or stop before you complete your prescribed course.  Clean your hands often, especially before and after changing your wound dressing or bandage.  People who live with you should clean their hands often as well.  Keep any wounds clean and change bandages as instructed until healed.  Avoid sharing personal items such as towels or razors.  Wash and dry your clothes and bed linens in the warmest temperatures recommended on the labels.  Tell your healthcare providers that you have MRSA. This includes home health nurses and aides, therapists, and personnel in doctors' offices.  Your doctor may have more instructions for you. If you have questions, please ask your doctor or  nurse. Developed and co-sponsored by [employer] for [employer] of [place] [phone]); Infectious Diseases Society of America (IDSA); [facility]; Association for Professionals in Infection Control and Epidemiology (APIC); Centers for Disease Control and Prevention (CDC); and [employer]   This information is not intended to replace advice given to you by your health care provider. Make sure you discuss any questions you have with your health care provider.   Document Released: 12/08/2013 Document Reviewed: 02/16/2015 Elsevier Interactive Patient Education [employer]

## 2016-01-27 NOTE — ED Notes (Signed)
Pt c/o rash/bumps to trunk x 2-3 weeks. Bumps are painful, some with surrounding redness and whiteheads. H/o folliculitis. He has done bleach baths at home. H/o MRSA. Currently feels "flu-like", temp 101 last night.

## 2016-01-30 ENCOUNTER — Telehealth: Payer: Self-pay | Admitting: *Deleted

## 2016-01-30 MED ORDER — SULFAMETHOXAZOLE-TRIMETHOPRIM 800-160 MG PO TABS: 1.0000 | ORAL_TABLET | Freq: Two times a day (BID) | ORAL | Status: AC

## 2016-03-07 ENCOUNTER — Telehealth: Payer: Self-pay | Admitting: *Deleted

## 2016-06-25 ENCOUNTER — Encounter: Payer: Self-pay | Admitting: Physician Assistant

## 2016-06-26 ENCOUNTER — Ambulatory Visit (INDEPENDENT_AMBULATORY_CARE_PROVIDER_SITE_OTHER): Payer: Self-pay | Admitting: Family Medicine

## 2016-06-26 DIAGNOSIS — Z5329 Procedure and treatment not carried out because of patient's decision for other reasons: Secondary | ICD-10-CM

## 2016-06-27 NOTE — Progress Notes (Signed)
No show. F/u PRN 

## 2017-01-24 ENCOUNTER — Encounter: Payer: Self-pay | Admitting: Emergency Medicine

## 2017-01-24 ENCOUNTER — Emergency Department
Admission: EM | Admit: 2017-01-24 | Discharge: 2017-01-24 | Disposition: A | Discharge status: Home / Self Care | Payer: Commercial Managed Care - PPO | Source: Home / Self Care | Attending: Family Medicine | Admitting: Family Medicine

## 2017-01-24 DIAGNOSIS — J111 Influenza due to unidentified influenza virus with other respiratory manifestations: Secondary | ICD-10-CM

## 2017-01-24 DIAGNOSIS — R69 Illness, unspecified: Secondary | ICD-10-CM

## 2017-01-24 LAB — POCT RAPID STREP A (OFFICE): Rapid Strep A Screen: NEGATIVE

## 2017-01-24 MED ORDER — OSELTAMIVIR PHOSPHATE 75 MG PO CAPS: 75.0000 mg | ORAL_CAPSULE | Freq: Two times a day (BID) | ORAL | 0 refills | Status: DC

## 2017-01-24 NOTE — ED Provider Notes (Signed)
[facility]    CSN: [id number] Arrival date & time: 01/24/17  4098     History   Chief Complaint Chief Complaint  Patient presents with  . Nausea  . Emesis  . Diarrhea  . Chills    HPI Dale Roberts is a 46 y.o. male.   Six days ago in the evening patient suddenly developed nausea/vomiting and watery diarrhea.  The next day he felt fatigued with chills/sweats and fever to 99.5.  He tried to go to work but felt increasingly ill.  He states that he has been exposed to a co-worker with strep pharyngitis, and others with flu.  Over the past three days he has developed increasing myalgias, fatigue, headache, and cough.  His diarrhea resolved but he still has nausea.  Through a [facility] encounter, he received Rx for Zofran which has improved his nausea.   The history is provided by the patient.    Past Medical History:  Diagnosis Date  . Anxiety   . Sleep concern   . Stress headaches     Patient Active Problem List   Diagnosis Date Noted  . Testicular pain 10/10/2012  . Epididymitis, right 10/10/2012  . MRSA INFECTION 02/27/2008    Past Surgical History:  Procedure Laterality Date  . KNEE ARTHROSCOPY  2005  . LUNG SURGERY  2006   MRSA removed       Home Medications    Prior to Admission medications   Medication Sig Start Date End Date Taking? Authorizing Provider  ondansetron (ZOFRAN) 4 MG tablet Take 4 mg by mouth every 8 (eight) hours as needed for nausea or vomiting.   Yes Historical Provider, MD  oseltamivir (TAMIFLU) 75 MG capsule Take 1 capsule (75 mg total) by mouth every 12 (twelve) hours. 01/24/17   Lattie Haw, MD    Family History History reviewed. No pertinent family history.  Social History Social History  Substance Use Topics  . Smoking status: Never Smoker  . Smokeless tobacco: Never Used  . Alcohol use 1.2 oz/week    2 Standard drinks or equivalent per week     Allergies   Patient has no known allergies.   Review of  Systems Review of Systems + sore throat + cough No pleuritic pain No wheezing + nasal congestion + post-nasal drainage No sinus pain/pressure No itchy/red eyes No earache No hemoptysis No SOB + fever, + chills + nausea + vomiting No abdominal pain + diarrhea No urinary symptoms No skin rash + fatigue + myalgias + headache Used OTC meds without relief   Physical Exam Triage Vital Signs ED Triage Vitals  Enc Vitals Group     BP 01/24/17 0951 116/80     Pulse Rate 01/24/17 0951 91     Resp 01/24/17 0951 16     Temp 01/24/17 0951 98.2 F (36.8 C)     Temp Source 01/24/17 0951 Oral     SpO2 01/24/17 0951 97 %     Weight 01/24/17 0952 242 lb (109.8 kg)     Height 01/24/17 0952 6' (1.829 m)     Head Circumference --      Peak Flow --      Pain Score 01/24/17 0954 0     Pain Loc --      Pain Edu? --      Excl. in GC? --    No data found.   Updated Vital Signs BP 116/80 (BP Location: Left Arm)   Pulse 91  Temp 98.2 F (36.8 C) (Oral)   Resp 16   Ht 6' (1.829 m)   Wt 242 lb (109.8 kg)   SpO2 97%   BMI 32.82 kg/m   Visual Acuity Right Eye Distance:   Left Eye Distance:   Bilateral Distance:    Right Eye Near:   Left Eye Near:    Bilateral Near:     Physical Exam Nursing notes and Vital Signs reviewed. Appearance:  Patient appears stated age, and in no acute distress Eyes:  Pupils are equal, round, and reactive to light and accomodation.  Extraocular movement is intact.  Conjunctivae are not inflamed  Ears:  Canals normal.  Tympanic membranes normal.  Nose:  Mildly congested turbinates.  No sinus tenderness.   Pharynx:   Uvula mildly erythematous Neck:  Supple.  Tender enlarged posterior/lateral nodes are palpated bilaterally  Lungs:  Clear to auscultation.  Breath sounds are equal.  Moving air well. Heart:  Regular rate and rhythm without murmurs, rubs, or gallops.  Abdomen:  Nontender without masses or hepatosplenomegaly.  Bowel sounds are  present.  No CVA or flank tenderness.  Extremities:  No edema.  Skin:  No rash present.    UC Treatments / Results  Labs (all labs ordered are listed, but only abnormal results are displayed) Labs Reviewed  POCT RAPID STREP A (OFFICE) negative    EKG  EKG Interpretation None       Radiology No results found.  Procedures Procedures (including critical care time)  Medications Ordered in UC Medications - No data to display   Initial Impression / Assessment and Plan / UC Course  I have reviewed the triage vital signs and the nursing notes.  Pertinent labs & imaging results that were available during my care of the patient were reviewed by me and considered in my medical decision making (see chart for details).    Begin Tamiflu. Take plain guaifenesin (1200mg  extended release tabs such as Mucinex) twice daily, with plenty of water, for cough and congestion.  May add Pseudoephedrine (30mg , one or two every 4 to 6 hours) for sinus congestion.  Get adequate rest.   May take Delsym Cough Suppressant at bedtime for nighttime cough.  Try warm salt water gargles for sore throat.  Stop all antihistamines for now, and other non-prescription cough/cold preparations. May take Ibuprofen 200mg , 4 tabs every 8 hours with food for body aches, headache, etc. May continue Ondansetron as needed for nausea.    Final Clinical Impressions(s) / UC Diagnoses   Final diagnoses:  Influenza-like illness    New Prescriptions New Prescriptions   OSELTAMIVIR (TAMIFLU) 75 MG CAPSULE    Take 1 capsule (75 mg total) by mouth every 12 (twelve) hours.     Lattie HawStephen A Beese, MD 01/24/17 1122

## 2017-01-24 NOTE — Discharge Instructions (Signed)
Take plain guaifenesin (1200mg  extended release tabs such as Mucinex) twice daily, with plenty of water, for cough and congestion.  May add Pseudoephedrine (30mg , one or two every 4 to 6 hours) for sinus congestion.  Get adequate rest.   May take Delsym Cough Suppressant at bedtime for nighttime cough.  Try warm salt water gargles for sore throat.  Stop all antihistamines for now, and other non-prescription cough/cold preparations. May take Ibuprofen 200mg , 4 tabs every 8 hours with food for body aches, headache, etc. May continue Ondansetron as needed for nausea.

## 2017-01-24 NOTE — ED Triage Notes (Signed)
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 Has been eating and drinking but not maintaining; about 3 pound weight loss.

## 2017-06-25 ENCOUNTER — Ambulatory Visit (INDEPENDENT_AMBULATORY_CARE_PROVIDER_SITE_OTHER): Payer: Commercial Managed Care - PPO

## 2017-06-25 ENCOUNTER — Ambulatory Visit (INDEPENDENT_AMBULATORY_CARE_PROVIDER_SITE_OTHER): Payer: Commercial Managed Care - PPO | Admitting: Sports Medicine

## 2017-06-25 DIAGNOSIS — M7701 Medial epicondylitis, right elbow: Secondary | ICD-10-CM

## 2017-06-25 DIAGNOSIS — M25521 Pain in right elbow: Secondary | ICD-10-CM

## 2017-06-25 DIAGNOSIS — M7702 Medial epicondylitis, left elbow: Secondary | ICD-10-CM | POA: Insufficient documentation

## 2017-06-25 MED ORDER — MELOXICAM 15 MG PO TABS: ORAL_TABLET | ORAL | 3 refills | Status: DC

## 2017-06-25 NOTE — Progress Notes (Signed)
   Subjective:    I'm seeing this patient as a consultation for:  Dale GawJade Breeback, PA-C  CC: Right elbow pain  HPI: This is a pleasant 46 year old male and list, for the past several weeks he's had increasing pain in his right medial elbow, he does tell me he recently started playing more golf. He is also been in the gym trying showoff to his friends and left the same amount of weight as his son and some other 48 year old guys in the gym. Pain is localized directly over the medial upper condyle, moderate, persistent, radiation to the mid forearm, makes it difficult to open doors, shake hands, and open pain of her chart.   Past medical history:  Negative.  See flowsheet/record as well for more information.  Surgical history: Negative.  See flowsheet/record as well for more information.  Family history: Negative.  See flowsheet/record as well for more information.  Social history: Negative.  See flowsheet/record as well for more information.  Allergies, and medications have been entered into the medical record, reviewed, and no changes needed.   Review of Systems: No headache, visual changes, nausea, vomiting, diarrhea, constipation, dizziness, abdominal pain, skin rash, fevers, chills, night sweats, weight loss, swollen lymph nodes, body aches, joint swelling, muscle aches, chest pain, shortness of breath, mood changes, visual or auditory hallucinations.   Objective:   General: Well Developed, well nourished, and in no acute distress.  Neuro/Psych: Alert and oriented x3, extra-ocular muscles intact, able to move all 4 extremities, sensation grossly intact. Skin: Warm and dry, no rashes noted.  Respiratory: Not using accessory muscles, speaking in full sentences, trachea midline.  Cardiovascular: Pulses palpable, no extremity edema. Abdomen: Does not appear distended. Right Elbow: Unremarkable to inspection. Range of motion full pronation, supination, flexion, extension. Strength is full to  all of the above directions Stable to varus, valgus stress. Negative moving valgus stress test. Exquisitely tender to palpation over the medial epicondyles with reproduction of pain with resisted pronation, flexion, ulnar deviation of the wrist. Ulnar nerve does not sublux. Negative cubital tunnel Tinel's.  Procedure: Real-time Ultrasound Guided Injection of right common flexor tendon origin. Device: GE Logiq E  Verbal informed consent obtained.  Time-out conducted.  Noted no overlying erythema, induration, or other signs of local infection.  Skin prepped in a sterile fashion.  Local anesthesia: Topical Ethyl chloride.  With sterile technique and under real time ultrasound guidance:  I injected 1 mL Kenalog 40, 1 mL lidocaine, 1 mL bupivacaine easily. Completed without difficulty  Pain immediately resolved suggesting accurate placement of the medication.  Advised to call if fevers/chills, erythema, induration, drainage, or persistent bleeding.  Images permanently stored and available for review in the ultrasound unit.  Impression: Technically successful ultrasound guided injection.  Impression and Recommendations:   This case required medical decision making of moderate complexity.  Medial epicondylitis, right Has failed conservative measures, injection as above, elbow sleeve, meloxicam, formal physical therapy. Return in one month.

## 2017-06-25 NOTE — Assessment & Plan Note (Signed)
Has failed conservative measures, injection as above, elbow sleeve, meloxicam, formal physical therapy. Return in one month.

## 2017-07-23 ENCOUNTER — Telehealth: Payer: Self-pay | Admitting: *Deleted

## 2017-07-23 NOTE — Telephone Encounter (Signed)
Pt called back requesting that we send his records from his visit on 01/27/16 to [facility]. I explained to him that we would send his records, but if they needed a referral, request a current note or evaluation he would need to be seen. Pt agrees.

## 2017-07-23 NOTE — Telephone Encounter (Signed)
Pt called requesting a referral to infection disease for a f/u of his MRSA from his visit 01/27/2016. Advised him her would need to call his PCP for a referral since we have not seen him for this problem in over a year.

## 2017-07-24 ENCOUNTER — Ambulatory Visit: Payer: Commercial Managed Care - PPO | Admitting: Family Medicine

## 2017-07-25 ENCOUNTER — Ambulatory Visit: Payer: Commercial Managed Care - PPO | Admitting: Sports Medicine
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
## 2018-03-14 ENCOUNTER — Ambulatory Visit (INDEPENDENT_AMBULATORY_CARE_PROVIDER_SITE_OTHER): Payer: PRIVATE HEALTH INSURANCE | Admitting: Family Medicine

## 2018-03-14 ENCOUNTER — Encounter: Payer: Self-pay | Admitting: Family Medicine

## 2018-03-14 ENCOUNTER — Ambulatory Visit: Payer: Self-pay

## 2018-03-14 VITALS — BP 118/70 | HR 81 | Ht 72.0 in | Wt 251.0 lb

## 2018-03-14 DIAGNOSIS — M25521 Pain in right elbow: Secondary | ICD-10-CM

## 2018-03-14 DIAGNOSIS — M7701 Medial epicondylitis, right elbow: Secondary | ICD-10-CM | POA: Diagnosis not present

## 2018-03-14 MED ORDER — GABAPENTIN 100 MG PO CAPS: 200.0000 mg | ORAL_CAPSULE | Freq: Every day | ORAL | 3 refills | Status: DC

## 2018-03-14 MED ORDER — NITROGLYCERIN 0.2 MG/HR TD PT24: MEDICATED_PATCH | TRANSDERMAL | 1 refills | Status: DC

## 2018-03-14 MED ORDER — VITAMIN D (ERGOCALCIFEROL) 1.25 MG (50000 UNIT) PO CAPS: 50000.0000 [IU] | ORAL_CAPSULE | ORAL | 0 refills | Status: DC

## 2018-03-14 NOTE — Patient Instructions (Addendum)
Good to see you.  Ice 20 minutes 2 times daily. Usually after activity and before bed. No lifting underhand including your cell phone Wrist brace day and night for 2 weeks then nightly for 2 weeks.  Once weekly vitamin D for 12 weeks pennsaid pinkie amount topically 2 times daily as needed.   Gabapentin  at night in case it is nerve related  Nitroglycerin Protocol   Apply 1/4 nitroglycerin patch to affected area daily.  Change position of patch within the affected area every 24 hours.  You may experience a headache during the first 1-2 weeks of using the patch, these should subside.  If you experience headaches after beginning nitroglycerin patch treatment, you may take your preferred over the counter pain reliever.  Another side effect of the nitroglycerin patch is skin irritation or rash related to patch adhesive.  Please notify our office if you develop more severe headaches or rash, and stop the patch.  Tendon healing with nitroglycerin patch may require 12 to 24 weeks depending on the extent of injury.  Men should not use if taking Viagra, Cialis, or Levitra.   Do not use if you have migraines or rosacea.  2 weeks off from tae kwon do  See me again in 4 weeks

## 2018-03-14 NOTE — Assessment & Plan Note (Signed)
Patient does have more of the medial epicondylitis and does have some intrasubstance tearing.  Patient started on nitroglycerin, gabapentin for the ulnar nerve irritation, vitamin D as well.  Discussed proper lifting mechanics.  Wrist brace given.  Once weekly vitamin D given.  Follow-up again in 4-6 weeks

## 2018-04-15 ENCOUNTER — Ambulatory Visit: Payer: Self-pay | Admitting: Family Medicine

## 2019-01-05 ENCOUNTER — Emergency Department (INDEPENDENT_AMBULATORY_CARE_PROVIDER_SITE_OTHER)
Admission: EM | Admit: 2019-01-05 | Discharge: 2019-01-05 | Disposition: A | Discharge status: Home / Self Care | Payer: BLUE CROSS/BLUE SHIELD | Source: Home / Self Care | Attending: Family Medicine | Admitting: Family Medicine

## 2019-01-05 ENCOUNTER — Encounter: Payer: Self-pay | Admitting: Emergency Medicine

## 2019-01-05 DIAGNOSIS — R112 Nausea with vomiting, unspecified: Secondary | ICD-10-CM

## 2019-01-05 DIAGNOSIS — R197 Diarrhea, unspecified: Secondary | ICD-10-CM | POA: Diagnosis not present

## 2019-01-05 MED ORDER — ONDANSETRON 4 MG PO TBDP: ORAL_TABLET | ORAL | 0 refills | Status: DC

## 2019-01-05 NOTE — ED Provider Notes (Signed)
[facility]    CSN: [id number] Arrival date & time: 01/05/19  [id number]     History   Chief Complaint Chief Complaint  Patient presents with  . Nausea    HPI Donzetta SprungBilly S Olesky is a 48 y.o. male.   HPI  Donzetta SprungBilly S Vice is a 48 y.o. male presenting to UC with c/o nausea, vomiting and diarrhea that started yesterday. He had 2 episodes of loose stool yesterday, which resolved after taking imodium. He also had 2 episodes of vomiting with nausea yesterday and 1 episode of vomiting this morning. Nausea is intermittent, improved this morning after eating [employer].  His children had a stomach virus last week along with classmates.  Pt denies fever or chills. He does c/o nasal congestion with post-nasal drip for 3 days, improved with use of nasal spray prescribed by a TeleDoc last week.     Past Medical History:  Diagnosis Date  . Anxiety   . Sleep concern   . Stress headaches     Patient Active Problem List   Diagnosis Date Noted  . Medial epicondylitis, right 06/25/2017  . Testicular pain 10/10/2012  . Epididymitis, right 10/10/2012  . MRSA INFECTION 02/27/2008    Past Surgical History:  Procedure Laterality Date  . KNEE ARTHROSCOPY  2005  . LUNG SURGERY  2006   MRSA removed       Home Medications    Prior to Admission medications   Medication Sig Start Date End Date Taking? Authorizing Provider  gabapentin (NEURONTIN) 100 MG capsule Take 2 capsules (200 mg total) by mouth at bedtime. 03/14/18   Judi SaaSmith, Zachary M, DO  meloxicam (MOBIC) 15 MG tablet One tab PO qAM with breakfast for 2 weeks, then daily prn pain. 06/25/17   Monica Bectonhekkekandam, Thomas J, MD  nitroGLYCERIN (NITRODUR - DOSED IN MG/24 HR) 0.2 mg/hr patch 1/4 patch daily 03/14/18   Judi SaaSmith, Zachary M, DO  ondansetron (ZOFRAN ODT) 4 MG disintegrating tablet 4mg  ODT q4-6 hours prn nausea/vomit 01/05/19   Lurene ShadowPhelps, Ezana Hubbert O, PA-C  Vitamin D, Ergocalciferol, (DRISDOL) 50000 units CAPS capsule Take 1 capsule (50,000 Units  total) by mouth every 7 (seven) days. 03/14/18   Judi SaaSmith, Zachary M, DO    Family History History reviewed. No pertinent family history.  Social History Social History   Tobacco Use  . Smoking status: Never Smoker  . Smokeless tobacco: Never Used  Substance Use Topics  . Alcohol use: Yes    Alcohol/week: 2.0 standard drinks    Types: 2 Standard drinks or equivalent per week  . Drug use: No     Allergies   Patient has no known allergies.   Review of Systems Review of Systems  Constitutional: Negative for chills and fever.  HENT: Positive for congestion and postnasal drip. Negative for ear pain, sore throat, trouble swallowing and voice change.   Respiratory: Negative for cough and shortness of breath.   Cardiovascular: Negative for chest pain and palpitations.  Gastrointestinal: Positive for abdominal pain (mild, generalized), diarrhea, nausea and vomiting.  Musculoskeletal: Negative for arthralgias, back pain and myalgias.  Skin: Negative for rash.     Physical Exam Triage Vital Signs ED Triage Vitals  Enc Vitals Group     BP 01/05/19 0946 124/86     Pulse Rate 01/05/19 0946 86     Resp --      Temp 01/05/19 0946 98.4 F (36.9 C)     Temp Source 01/05/19 0946 Oral     SpO2 01/05/19 0946  98 %     Weight 01/05/19 0949 238 lb (108 kg)     Height --      Head Circumference --      Peak Flow --      Pain Score 01/05/19 0948 0     Pain Loc --      Pain Edu? --      Excl. in GC? --    No data found.  Updated Vital Signs BP 124/86 (BP Location: Right Arm)   Pulse 86   Temp 98.4 F (36.9 C) (Oral)   Wt 238 lb (108 kg)   SpO2 98%   BMI 32.28 kg/m   Visual Acuity Right Eye Distance:   Left Eye Distance:   Bilateral Distance:    Right Eye Near:   Left Eye Near:    Bilateral Near:     Physical Exam Vitals signs and nursing note reviewed.  Constitutional:      Appearance: Normal appearance. He is well-developed.  HENT:     Head: Normocephalic and  atraumatic.     Right Ear: Tympanic membrane normal.     Left Ear: Tympanic membrane normal.     Nose: Nose normal.     Right Sinus: No maxillary sinus tenderness or frontal sinus tenderness.     Left Sinus: No maxillary sinus tenderness or frontal sinus tenderness.     Mouth/Throat:     Lips: Pink.     Mouth: Mucous membranes are moist.     Pharynx: Oropharynx is clear. Uvula midline.  Neck:     Musculoskeletal: Normal range of motion.  Cardiovascular:     Rate and Rhythm: Normal rate and regular rhythm.  Pulmonary:     Effort: Pulmonary effort is normal. No respiratory distress.     Breath sounds: Normal breath sounds. No stridor. No wheezing or rhonchi.  Abdominal:     General: There is no distension.     Palpations: Abdomen is soft.     Tenderness: There is no abdominal tenderness.  Musculoskeletal: Normal range of motion.  Lymphadenopathy:     Cervical: No cervical adenopathy.  Skin:    General: Skin is warm and dry.  Neurological:     Mental Status: He is alert and oriented to person, place, and time.  Psychiatric:        Behavior: Behavior normal.      UC Treatments / Results  Labs (all labs ordered are listed, but only abnormal results are displayed) Labs Reviewed - No data to display  EKG None  Radiology No results found.  Procedures Procedures (including critical care time)  Medications Ordered in UC Medications - No data to display  Initial Impression / Assessment and Plan / UC Course  I have reviewed the triage vital signs and the nursing notes.  Pertinent labs & imaging results that were available during my care of the patient were reviewed by me and considered in my medical decision making (see chart for details).     Benign abdominal exam. Pt declined zofran in UC. Pt appears well Hx and exam c/w viral illness Encouraged symptomatic tx  Final Clinical Impressions(s) / UC Diagnoses   Final diagnoses:  Nausea vomiting and diarrhea      Discharge Instructions      Be sure to get a lot of rest and stay well hydrated with sports drinks, water, diluted juices, and clear sodas.   Avoid fried fatty food, spicy food, and milk as these foods can cause worsening  stomach upset.   Please follow up with family medicine in 2-3 days if not improving.     ED Prescriptions    Medication Sig Dispense Auth. Provider   ondansetron (ZOFRAN ODT) 4 MG disintegrating tablet 4mg  ODT q4-6 hours prn nausea/vomit 10 tablet Lurene Shadow, PA-C     Controlled Substance Prescriptions [place] Controlled Substance Registry consulted? Not Applicable   Lurene Shadow, PA-C 01/05/19 1051

## 2019-01-05 NOTE — ED Triage Notes (Signed)
Pt c/o nausea and vomiting yesterday. He has been having sinus pressure and post nasal drip since Friday.

## 2019-01-05 NOTE — Discharge Instructions (Signed)
°  Be sure to get a lot of rest and stay well hydrated with sports drinks, water, diluted juices, and clear sodas.   Avoid fried fatty food, spicy food, and milk as these foods can cause worsening stomach upset.   Please follow up with family medicine in 2-3 days if not improving.

## 2019-05-06 ENCOUNTER — Encounter: Payer: Self-pay | Admitting: Physician Assistant

## 2019-05-06 ENCOUNTER — Ambulatory Visit (INDEPENDENT_AMBULATORY_CARE_PROVIDER_SITE_OTHER): Payer: BLUE CROSS/BLUE SHIELD | Admitting: Physician Assistant

## 2019-05-06 VITALS — BP 130/84 | HR 86 | Temp 98.5°F | Ht 73.0 in | Wt 237.0 lb

## 2019-05-06 DIAGNOSIS — Z13220 Encounter for screening for lipoid disorders: Secondary | ICD-10-CM

## 2019-05-06 DIAGNOSIS — L600 Ingrowing nail: Secondary | ICD-10-CM | POA: Diagnosis not present

## 2019-05-06 DIAGNOSIS — N529 Male erectile dysfunction, unspecified: Secondary | ICD-10-CM | POA: Diagnosis not present

## 2019-05-06 DIAGNOSIS — Z131 Encounter for screening for diabetes mellitus: Secondary | ICD-10-CM

## 2019-05-06 DIAGNOSIS — R7989 Other specified abnormal findings of blood chemistry: Secondary | ICD-10-CM | POA: Diagnosis not present

## 2019-05-06 MED ORDER — HYDROCODONE-ACETAMINOPHEN 5-325 MG PO TABS: 1.0000 | ORAL_TABLET | Freq: Four times a day (QID) | ORAL | 0 refills | Status: AC | PRN

## 2019-05-06 MED ORDER — SILDENAFIL CITRATE 100 MG PO TABS: 100.0000 mg | ORAL_TABLET | Freq: Every day | ORAL | 0 refills | Status: DC | PRN

## 2019-05-06 NOTE — Progress Notes (Addendum)
Subjective:    Patient ID: Dale Roberts, male    DOB: 09/27/1971, 48 y.o.   MRN: [id number]  HPI  Pt is a 48 yo male who presents to the clinic to discuss painful left great toenail. He has not been here in a while and wonders if he can get labs drawn too.   Left great toenail appears to be ingrown and thick. It is painful when he walks at times and when he tries to play sports. He would like removed today. No redness or drainage. Ongoing problem for over a year but getting worse.   Pt also mentions having some erectile dysfunction problems in last few months. No real problem with libido but maintaining erection. No new meds. He is taking [facility] testosterone boosters. He is more stressed and they have young children which often makes it hard to plan time for intimacy.   .. Active Ambulatory Problems    Diagnosis Date Noted  . MRSA INFECTION 02/27/2008  . Testicular pain 10/10/2012  . Epididymitis, right 10/10/2012  . Medial epicondylitis, right 06/25/2017  . Ingrown left greater toenail 05/08/2019  . Erectile dysfunction 05/08/2019   Resolved Ambulatory Problems    Diagnosis Date Noted  . Infectious diarrhea(009.2) 12/05/2007  . Carbuncle and furuncle of unspecified site 02/27/2008   Past Medical History:  Diagnosis Date  . Anxiety   . Sleep concern   . Stress headaches      Review of Systems  All other systems reviewed and are negative.      Objective:   Physical Exam Vitals signs reviewed.  Constitutional:      Appearance: Normal appearance.  Cardiovascular:     Rate and Rhythm: Normal rate and regular rhythm.     Pulses: Normal pulses.     Heart sounds: Normal heart sounds. No murmur.  Pulmonary:     Effort: Pulmonary effort is normal.     Breath sounds: Normal breath sounds.  Skin:    Comments: Left thick medial nail ingrown. No surrounding redness, tenderness or drainage.   Neurological:     General: No focal deficit present.     Mental Status: He is  alert and oriented to person, place, and time.  Psychiatric:        Mood and Affect: Mood normal.        Behavior: Behavior normal.           Assessment & Plan:  Marland Kitchen.Marland Kitchen.Dale Roberts was seen today for annual exam.  Diagnoses and all orders for this visit:  Ingrown left greater toenail -     HYDROcodone-acetaminophen (NORCO/VICODIN) 5-325 MG tablet; Take 1 tablet by mouth every 6 (six) hours as needed for up to 5 days for moderate pain.  Erectile dysfunction, unspecified erectile dysfunction type -     CBC with Differential/Platelet -     Testosterone -     sildenafil (VIAGRA) 100 MG tablet; Take 1 tablet (100 mg total) by mouth daily as needed for erectile dysfunction.  Screening for diabetes mellitus -     COMPLETE METABOLIC PANEL WITH GFR  Screening for lipid disorders -     Lipid Panel w/reflex Direct LDL   Toenail Avulsion Procedure Note  Pre-operative Diagnosis: Left Ingrown Great toenail   Post-operative Diagnosis: Left Ingrown Great toenail  Indications: pain  Anesthesia: Lidocaine 1% without epinephrine without added sodium bicarbonate  Procedure Details  History of allergy to iodine: no  The risks (including bleeding and infection) and benefits of the  procedure  and Verbal informed consent obtained.  After digital block anesthesia was obtained, a tourniquet was applied for hemostasis during the procedure.  After prepping with Betadine, the offending edge of the nail was freed from the nailbed and perionychium, and then split with scissors and removed with  forceps.  All visible granulation tissue is debrided. Antibiotic and bulky dressing was applied.   Findings: Ingrown toenail.  Complications: none.  Plan: 1. Soak the foot twice daily. Change dressing twice daily until healed over. 2. Warning signs of infection were reviewed.   3. Recommended that the patient use Vicodin as needed for pain.  4. Return in 2 weeks as needed.  Marland KitchenMarland KitchenPDMP not reviewed this  encounter.no concerns.  Small quantity of norco for pain.   ED questionnaire positive but not overtly for low libido.  Will check testosterone but discussed with patient testosterone can help with libido but not maintained erection.  HO given for other causes of ED.  viagra started before intercourse. Told to start with 1/2 tablet can use whole tablet.  Follow up as needed.   Fasting labs ordered.  BP went down on second recheck.

## 2019-05-06 NOTE — Patient Instructions (Addendum)
Ingrown Toenail An ingrown toenail occurs when the corner or sides of a toenail grow into the surrounding skin. This causes discomfort and pain. The big toe is most commonly affected, but any of the toes can be affected. If an ingrown toenail is not treated, it can become infected. What are the causes? This condition may be caused by:  Wearing shoes that are too small or tight.  An injury, such as stubbing your toe or having your toe stepped on.  Improper cutting or care of your toenails.  Having nail or foot abnormalities that were present from birth (congenital abnormalities), such as having a nail that is too big for your toe. What increases the risk? The following factors may make you more likely to develop ingrown toenails:  Age. Nails tend to get thicker with age, so ingrown nails are more common among older people.  Cutting your toenails incorrectly, such as cutting them very short or cutting them unevenly. An ingrown toenail is more likely to get infected if you have:  Diabetes.  Blood flow (circulation) problems. What are the signs or symptoms? Symptoms of an ingrown toenail may include:  Pain, soreness, or tenderness.  Redness.  Swelling.  Hardening of the skin that surrounds the toenail. Signs that an ingrown toenail may be infected include:  Fluid or pus.  Symptoms that get worse instead of better. How is this diagnosed? An ingrown toenail may be diagnosed based on your medical history, your symptoms, and a physical exam. If you have fluid or blood coming from your toenail, a sample may be collected to test for the specific type of bacteria that is causing the infection. How is this treated? Treatment depends on how severe your ingrown toenail is. You may be able to care for your toenail at home.  If you have an infection, you may be prescribed antibiotic medicines.  If you have fluid or pus draining from your toenail, your health care provider may drain it.   If you have trouble walking, you may be given crutches to use.  If you have a severe or infected ingrown toenail, you may need a procedure to remove part or all of the nail. Follow these instructions at home: Foot care   Do not pick at your toenail or try to remove it yourself.  Soak your foot in warm, soapy water. Do this for 20 minutes, 3 times a day, or as often as told by your health care provider. This helps to keep your toe clean and keep your skin soft.  Wear shoes that fit well and are not too tight. Your health care provider may recommend that you wear open-toed shoes while you heal.  Trim your toenails regularly and carefully. Cut your toenails straight across to prevent injury to the skin at the corners of the toenail. Do not cut your nails in a curved shape.  Keep your feet clean and dry to help prevent infection. Medicines  Take over-the-counter and prescription medicines only as told by your health care provider.  If you were prescribed an antibiotic, take it as told by your health care provider. Do not stop taking the antibiotic even if you start to feel better. Activity  Return to your normal activities as told by your health care provider. Ask your health care provider what activities are safe for you.  Avoid activities that cause pain. General instructions  If your health care provider told you to use crutches to help you move around, use them   as instructed.  Keep all follow-up visits as told by your health care provider. This is important. Contact a health care provider if:  You have more redness, swelling, pain, or other symptoms that do not improve with treatment.  You have fluid, blood, or pus coming from your toenail. Get help right away if:  You have a red streak on your skin that starts at your foot and spreads up your leg.  You have a fever. Summary  An ingrown toenail occurs when the corner or sides of a toenail grow into the surrounding skin.  This causes discomfort and pain. The big toe is most commonly affected, but any of the toes can be affected.  If an ingrown toenail is not treated, it can become infected.  Fluid or pus draining from your toenail is a sign of infection. Your health care provider may need to drain it. You may be given antibiotics to treat the infection.  Trimming your toenails regularly and properly can help you prevent an ingrown toenail. This information is not intended to replace advice given to you by your health care provider. Make sure you discuss any questions you have with your health care provider. Document Released: 11/30/2000 Document Revised: 08/21/2017 Document Reviewed: 08/21/2017 Elsevier Interactive Patient Education  2019 [employer] Erectile Dysfunction Erectile dysfunction (ED) is the inability to get or keep an erection in order to have sexual intercourse. Erectile dysfunction may include:  Inability to get an erection.  Lack of enough hardness of the erection to allow penetration.  Loss of the erection before sex is finished. What are the causes? This condition may be caused by:  Certain medicines, such as: ? Pain relievers. ? Antihistamines. ? Antidepressants. ? Blood pressure medicines. ? Water pills (diuretics). ? Ulcer medicines. ? Muscle relaxants. ? Drugs.  Excessive drinking.  Psychological causes, such as: ? Anxiety. ? Depression. ? Sadness. ? Exhaustion. ? Performance fear. ? Stress.  Physical causes, such as: ? Artery problems. This may include diabetes, smoking, liver disease, or atherosclerosis. ? High blood pressure. ? Hormonal problems, such as low testosterone. ? Obesity. ? Nerve problems. This may include back or pelvic injuries, diabetes mellitus, multiple sclerosis, or Parkinson disease. What are the signs or symptoms? Symptoms of this condition include:  Inability to get an erection.  Lack of enough hardness of the erection to allow  penetration.  Loss of the erection before sex is finished.  Normal erections at some times, but with frequent unsatisfactory episodes.  Low sexual satisfaction in either partner due to erection problems.  A curved penis occurring with erection. The curve may cause pain or the penis may be too curved to allow for intercourse.  Never having nighttime erections. How is this diagnosed? This condition is often diagnosed by:  Performing a physical exam to find other diseases or specific problems with the penis.  Asking you detailed questions about the problem.  Performing blood tests to check for diabetes mellitus or to measure hormone levels.  Performing other tests to check for underlying health conditions.  Performing an ultrasound exam to check for scarring.  Performing a test to check blood flow to the penis.  Doing a sleep study at home to measure nighttime erections. How is this treated? This condition may be treated by:  Medicine taken by mouth to help you achieve an erection (oral medicine).  Hormone replacement therapy to replace low testosterone levels.  Medicine that is injected into the penis. Your health care provider may instruct  you how to give yourself these injections at home.  Vacuum pump. This is a pump with a ring on it. The pump and ring are placed on the penis and used to create pressure that helps the penis become erect.  Penile implant surgery. In this procedure, you may receive: ? An inflatable implant. This consists of cylinders, a pump, and a reservoir. The cylinders can be inflated with a fluid that helps to create an erection, and they can be deflated after intercourse. ? A semi-rigid implant. This consists of two silicone rubber rods. The rods provide some rigidity. They are also flexible, so the penis can both curve downward in its normal position and become straight for sexual intercourse.  Blood vessel surgery, to improve blood flow to the penis.  During this procedure, a blood vessel from a different part of the body is placed into the penis to allow blood to flow around (bypass) damaged or blocked blood vessels.  Lifestyle changes, such as exercising more, losing weight, and quitting smoking. Follow these instructions at home: Medicines   Take over-the-counter and prescription medicines only as told by your health care provider. Do not increase the dosage without first discussing it with your health care provider.  If you are using self-injections, perform injections as directed by your health care provider. Make sure to avoid any veins that are on the surface of the penis. After giving an injection, apply pressure to the injection site for 5 minutes. General instructions  Exercise regularly, as directed by your health care provider. Work with your health care provider to lose weight, if needed.  Do not use any products that contain nicotine or tobacco, such as cigarettes and e-cigarettes. If you need help quitting, ask your health care provider.  Before using a vacuum pump, read the instructions that come with the pump and discuss any questions with your health care provider.  Keep all follow-up visits as told by your health care provider. This is important. Contact a health care provider if:  You feel nauseous.  You vomit. Get help right away if:  You are taking oral or injectable medicines and you have an erection that lasts longer than 4 hours. If your health care provider is unavailable, go to the nearest emergency room for evaluation. An erection that lasts much longer than 4 hours can result in permanent damage to your penis.  You have severe pain in your groin or abdomen.  You develop redness or severe swelling of your penis.  You have redness spreading up into your groin or lower abdomen.  You are unable to urinate.  You experience chest pain or a rapid heart beat (palpitations) after taking oral medicines.  Summary  Erectile dysfunction (ED) is the inability to get or keep an erection during sexual intercourse. This problem can usually be treated successfully.  This condition is diagnosed based on a physical exam, your symptoms, and tests to determine the cause. Treatment varies depending on the cause, and may include medicines, hormone therapy, surgery, or vacuum pump.  You may need follow-up visits to make sure that you are using your medicines or devices correctly.  Get help right away if you are taking or injecting medicines and you have an erection that lasts longer than 4 hours. This information is not intended to replace advice given to you by your health care provider. Make sure you discuss any questions you have with your health care provider. Document Released: 11/30/2000 Document Revised: 12/19/2016 Document Reviewed: 12/19/2016  Chartered certified accountant Patient Education  [employer].

## 2019-05-07 LAB — LIPID PANEL W/REFLEX DIRECT LDL
Cholesterol: 260 mg/dL — ABNORMAL HIGH (ref <200)
HDL: 46 mg/dL (ref >=40)
LDL Cholesterol (Calc): 163 mg/dL (calc) — ABNORMAL HIGH
Non-HDL Cholesterol (Calc): 214 mg/dL (calc) — ABNORMAL HIGH (ref <130)
Total CHOL/HDL Ratio: 5.7 (calc) — ABNORMAL HIGH (ref <5.0)
Triglycerides: 333 mg/dL — ABNORMAL HIGH (ref <150)

## 2019-05-07 LAB — COMPLETE METABOLIC PANEL WITH GFR
AG Ratio: 1.6 (calc) (ref 1.0–2.5)
ALT: 47 U/L — ABNORMAL HIGH (ref 9–46)
AST: 23 U/L (ref 10–40)
Albumin: 4.4 g/dL (ref 3.6–5.1)
Alkaline phosphatase (APISO): 64 U/L (ref 36–130)
BUN: 15 mg/dL (ref 7–25)
CO2: 24 mmol/L (ref 20–32)
Calcium: 9.6 mg/dL (ref 8.6–10.3)
Chloride: 106 mmol/L (ref 98–110)
Creat: 0.94 mg/dL (ref 0.60–1.35)
GFR, Est African American: 110 mL/min/{1.73_m2} (ref >=60)
GFR, Est Non African American: 95 mL/min/{1.73_m2} (ref >=60)
Globulin: 2.7 g/dL (calc) (ref 1.9–3.7)
Glucose, Bld: 87 mg/dL (ref 65–99)
Potassium: 4.4 mmol/L (ref 3.5–5.3)
Sodium: 139 mmol/L (ref 135–146)
Total Bilirubin: 0.3 mg/dL (ref 0.2–1.2)
Total Protein: 7.1 g/dL (ref 6.1–8.1)

## 2019-05-07 LAB — CBC WITH DIFFERENTIAL/PLATELET
Absolute Monocytes: 551 cells/uL (ref 200–950)
Basophils Absolute: 80 cells/uL (ref 0–200)
Basophils Relative: 1.5 %
Eosinophils Absolute: 138 cells/uL (ref 15–500)
Eosinophils Relative: 2.6 %
HCT: 41.3 % (ref 38.5–50.0)
Hemoglobin: 14.1 g/dL (ref 13.2–17.1)
Lymphs Abs: 1617 cells/uL (ref 850–3900)
MCH: 30.8 pg (ref 27.0–33.0)
MCHC: 34.1 g/dL (ref 32.0–36.0)
MCV: 90.2 fL (ref 80.0–100.0)
MPV: 11.1 fL (ref 7.5–12.5)
Monocytes Relative: 10.4 %
Neutro Abs: 2915 cells/uL (ref 1500–7800)
Neutrophils Relative %: 55 %
Platelets: 279 10*3/uL (ref 140–400)
RBC: 4.58 10*6/uL (ref 4.20–5.80)
RDW: 12.9 % (ref 11.0–15.0)
Total Lymphocyte: 30.5 %
WBC: 5.3 10*3/uL (ref 3.8–10.8)

## 2019-05-07 LAB — TESTOSTERONE: Testosterone: 305 ng/dL (ref 250–827)

## 2019-05-08 ENCOUNTER — Encounter: Payer: Self-pay | Admitting: Physician Assistant

## 2019-05-08 DIAGNOSIS — N529 Male erectile dysfunction, unspecified: Secondary | ICD-10-CM | POA: Insufficient documentation

## 2019-05-08 DIAGNOSIS — E785 Hyperlipidemia, unspecified: Secondary | ICD-10-CM | POA: Insufficient documentation

## 2019-05-08 DIAGNOSIS — L600 Ingrowing nail: Secondary | ICD-10-CM | POA: Insufficient documentation

## 2019-05-08 NOTE — Progress Notes (Signed)
Call pt:  Kidney and sugar look great.  One liver enzyme just a hair elevated. Not concerned. Better than hx.   Elevated cholesterol with LDL 163 and TG 333. My suggestion is to start fish oil 4000mg  daily and to lower high fatty foods and processed foods. Recheck in 6 months.   Testosterone is normal but on the lower limit of normal. Your testosterone boosters are probably what are barely keeping you in range. You may want to go off testosterone boosters and recheck in 4-6 weeks if below 300 insurance will pay for supplementation. This may not help ED but can increase libido and sometimes energy and mood.   Were you able to pick up medications? Were affordable?  How is your toe today?

## 2019-10-05 ENCOUNTER — Ambulatory Visit: Payer: BLUE CROSS/BLUE SHIELD | Admitting: Physician Assistant

## 2019-10-09 ENCOUNTER — Ambulatory Visit: Payer: BC Managed Care – PPO | Admitting: Physician Assistant

## 2019-12-10 ENCOUNTER — Other Ambulatory Visit: Payer: Self-pay | Admitting: Physician Assistant

## 2019-12-10 DIAGNOSIS — N529 Male erectile dysfunction, unspecified: Secondary | ICD-10-CM

## 2020-02-24 ENCOUNTER — Ambulatory Visit (INDEPENDENT_AMBULATORY_CARE_PROVIDER_SITE_OTHER): Payer: Self-pay | Admitting: Physician Assistant

## 2020-02-24 DIAGNOSIS — Z5329 Procedure and treatment not carried out because of patient's decision for other reasons: Secondary | ICD-10-CM

## 2020-02-24 NOTE — Progress Notes (Signed)
No show

## 2020-02-29 ENCOUNTER — Other Ambulatory Visit: Payer: Self-pay

## 2020-02-29 ENCOUNTER — Ambulatory Visit (INDEPENDENT_AMBULATORY_CARE_PROVIDER_SITE_OTHER): Payer: BC Managed Care – PPO | Admitting: Physician Assistant

## 2020-02-29 VITALS — BP 137/91 | HR 86 | Ht 72.0 in | Wt 243.0 lb

## 2020-02-29 DIAGNOSIS — Z13220 Encounter for screening for lipoid disorders: Secondary | ICD-10-CM

## 2020-02-29 DIAGNOSIS — R5383 Other fatigue: Secondary | ICD-10-CM

## 2020-02-29 DIAGNOSIS — M79674 Pain in right toe(s): Secondary | ICD-10-CM

## 2020-02-29 DIAGNOSIS — G479 Sleep disorder, unspecified: Secondary | ICD-10-CM | POA: Diagnosis not present

## 2020-02-29 MED ORDER — IBUPROFEN 800 MG PO TABS: 800.0000 mg | ORAL_TABLET | Freq: Three times a day (TID) | ORAL | 1 refills | Status: DC | PRN

## 2020-02-29 NOTE — Patient Instructions (Signed)
Suvorexant oral tablets °What is this medicine? °SUVOREXANT (su-vor-EX-ant) is used to treat insomnia. This medicine helps you to fall asleep and sleep through the night. °This medicine may be used for other purposes; ask your health care provider or pharmacist if you have questions. °COMMON BRAND NAME(S): Belsomra °What should I tell my health care provider before I take this medicine? °They need to know if you have any of these conditions: °· depression °· drink alcohol °· drug abuse or addiction °· feel sleepy or have fallen asleep suddenly during the day °· history of a sudden onset of muscle weakness (cataplexy) °· liver disease °· lung or breathing disease, like asthma or emphysema °· sleep apnea °· suicidal thoughts, plans, or attempt; a previous suicide attempt by you or a family member °· an unusual or allergic reaction to suvorexant, other medicines, foods, dyes, or preservatives °· pregnant or trying to get pregnant °· breast-feeding °How should I use this medicine? °Take this medicine by mouth within 30 minutes of going to bed. Do not take it unless you are able to stay in bed a full night before you must be active again. Follow the directions on the prescription label. You may take this medicine with or without a food. However, this medicine may take longer to work if you take it with or right after meals. Do not take your medicine more often than directed. Do not stop taking this medicine on your own. Always follow your doctor or health care professional's advice. °A special MedGuide will be given to you by the pharmacist with each prescription and refill. Be sure to read this information carefully each time. °Talk to your pediatrician regarding the use of this medicine in children. Special care may be needed. °Overdosage: If you think you have taken too much of this medicine contact a poison control center or emergency room at once. °NOTE: This medicine is only for you. Do not share this medicine with  others. °What if I miss a dose? °This medicine should only be taken immediately before going to sleep. Do not take double or extra doses. °What may interact with this medicine? °· alcohol °· antihistamines for allergy, cough, or cold °· aprepitant °· boceprevir °· certain antibiotics like ciprofloxacin, clarithromycin, erythromycin, telithromycin °· certain antivirals for HIV or AIDS °· certain medicines for anxiety or sleep °· certain medicines for depression like amitriptyline, fluoxetine, nefazodone, sertraline °· certain medicines for fungal infections like ketoconazole, posaconazole, fluconazole, itraconazole °· certain medicines for seizures like carbamazepine, phenobarbital, primidone, phenytoin °· conivaptan °· digoxin °· diltiazem °· general anesthetics like halothane, isoflurane, methoxyflurane, propofol °· grapefruit juice °· imatinib °· medicines that relax muscles for surgery °· narcotic medicines for pain °· phenothiazines like chlorpromazine, mesoridazine, prochlorperazine, thioridazine °· rifampin °· verapamil °This list may not describe all possible interactions. Give your health care provider a list of all the medicines, herbs, non-prescription drugs, or dietary supplements you use. Also tell them if you smoke, drink alcohol, or use illegal drugs. Some items may interact with your medicine. °What should I watch for while using this medicine? °Visit your health care professional for regular checks on your progress. Tell your health care professional if your symptoms do not start to get better or if they get worse. Avoid caffeine-containing drinks in the evening hours. °After taking this medicine, you may get up out of bed and do an activity that you do not know you are doing. The next morning, you may have no memory of this.   Activities include driving a car ("sleep-driving"), making and eating food, talking on the phone, sexual activity, and sleep-walking. Serious injuries have occurred. Call your  doctor right away if you find out you have done any of these activities. Do not take this medicine if you have used alcohol that evening. Do not take it if you have taken another medicine for sleep. °Do not take this medicine unless you are able to stay in bed for a full night (7 to 8 hours) and do not drive or perform other activities requiring full alertness within 8 hours of a dose. Do not drive, use machinery, or do anything that needs mental alertness the day after you take the 20 mg dose of this medicine. The use of lower doses (10 mg) may also cause driving impairment the next day. You may have a decrease in mental alertness the day after use, even if you feel that you are fully awake. Tell your doctor if you will need to perform activities requiring full alertness, such as driving, the next day. Do not stand or sit up quickly after taking this medicine, especially if you are an older patient. This reduces the risk of dizzy or fainting spells. °If you or your family notice any changes in your behavior, such as new or worsening depression, thoughts of harming yourself, anxiety, other unusual or disturbing thoughts, or memory loss, call your health care professional right away. °After you stop taking this medicine, you may have trouble falling asleep. This is called rebound insomnia. This problem usually goes away on its own after 1 or 2 nights. °What side effects may I notice from receiving this medicine? °Side effects that you should report to your doctor or health care professional as soon as possible: °· allergic reactions like skin rash, itching or hives, swelling of the face, lips, or tongue °· hallucinations °· periods of leg weakness lasting from seconds to a few minutes °· suicidal thoughts, mood changes °· unable to move or speak for several minutes while going to sleep or waking up °· unusual activities while not fully awake like driving, eating, making phone calls, or sexual activity °Side effects  that usually do not require medical attention (report these to your doctor or health care professional if they continue or are bothersome): °· daytime drowsiness °· headache °· nightmares or abnormal dreams °· tiredness °This list may not describe all possible side effects. Call your doctor for medical advice about side effects. You may report side effects to FDA at 1-800-FDA-1088. °Where should I keep my medicine? °Keep out of the reach of children. This medicine can be abused. Keep your medicine in a safe place to protect it from theft. Do not share this medicine with anyone. Selling or giving away this medicine is dangerous and against the law. °Store at room temperature between 15 and 30 degrees C (59 and 86 degrees F). Throw away any unused medicine after the expiration date. °NOTE: This sheet is a summary. It may not cover all possible information. If you have questions about this medicine, talk to your doctor, pharmacist, or health care provider. °© 2020 [employer]vier/Gold Standard (2018-12-19 16:37:12) ° °

## 2020-02-29 NOTE — Progress Notes (Signed)
Subjective:    Patient ID: Dale Roberts, male    DOB: 10-26-1971, 49 y.o.   MRN: [id number]  HPI  Pt is a 49 yo male who presents to the clinic with painful right great toe for over a year and worsening.   He went to sports medicine office and suspected arthritis/gout. Pt can touch it most of the time and does not really feel warm. He has flares when he is really active or on his feet a lot. Ibuprofen does help some. No known trauma or injury. He wants to get it fixed because effecting his activity level. He loves [employer] but at times the pain is too much.    Pt continues to have problems sleeping. He goes to sleep fine but wakes up multiple times in the middle of the night and cannot go back to sleep. He usually gets about 4-5 hours of sleep at night. Denies snoring but does not wake up feeling rested. Last testosterone check was a little low. He stopped testosterone booster and wanted rechecked.   .. Active Ambulatory Problems    Diagnosis Date Noted  . MRSA INFECTION 02/27/2008  . Testicular pain 10/10/2012  . Epididymitis, right 10/10/2012  . Medial epicondylitis, right 06/25/2017  . Ingrown left greater toenail 05/08/2019  . Erectile dysfunction 05/08/2019  . Hyperlipidemia with target LDL less than 130 05/08/2019  . Trouble in sleeping 03/01/2020  . No energy 03/01/2020  . Great toe pain, right 03/01/2020   Resolved Ambulatory Problems    Diagnosis Date Noted  . Infectious diarrhea(009.2) 12/05/2007  . Carbuncle and furuncle of unspecified site 02/27/2008   Past Medical History:  Diagnosis Date  . Anxiety   . Sleep concern   . Stress headaches       Review of Systems See HPI.     Objective:   Physical Exam Vitals reviewed.  Constitutional:      Appearance: Normal appearance.  HENT:     Head: Normocephalic.  Cardiovascular:     Rate and Rhythm: Normal rate and regular rhythm.     Pulses: Normal pulses.  Pulmonary:     Effort: Pulmonary effort is normal.      Breath sounds: Normal breath sounds.  Musculoskeletal:     Comments: Great right MCP joint right swollen and red but not warm. Tenderness to palpation.  Normal ROM and no displacement of great toe.   Neurological:     General: No focal deficit present.     Mental Status: He is alert and oriented to person, place, and time.  Psychiatric:        Mood and Affect: Mood normal.           Assessment & Plan:  Marland KitchenMarland KitchenAlyan was seen today for foot pain.  Diagnoses and all orders for this visit:  Great toe pain, right -     ibuprofen (ADVIL) 800 MG tablet; Take 1 tablet (800 mg total) by mouth every 8 (eight) hours as needed. -     Uric acid -     Ambulatory referral to Podiatry  No energy -     COMPLETE METABOLIC PANEL WITH GFR -     X38 and Folate Panel -     Testosterone -     VITAMIN D 25 Hydroxy (Vit-D Deficiency, Fractures) -     TSH -     CBC  Screening for lipid disorders -     Lipid Panel w/reflex Direct LDL  Trouble in sleeping  I do not suspect gout. Appears like arthritis. Could benefit from injection. No displacement of great toe at this time. Ibuprofen 800mg  given. Ice regularly. Wear good loose fitted shoes.  Will check uric acid and make referral to podiatry.   Discussed sleeping and good sleep routine. Encouraged belsomra to try. HO given. Denies snoring. Could consider sleep apnea testing for non-restorative sleep.   Labs ordered for maintaince and fatigue. Will recheck testosterone.

## 2020-03-01 ENCOUNTER — Encounter: Payer: Self-pay | Admitting: Physician Assistant

## 2020-03-01 DIAGNOSIS — G479 Sleep disorder, unspecified: Secondary | ICD-10-CM | POA: Insufficient documentation

## 2020-03-01 DIAGNOSIS — M79674 Pain in right toe(s): Secondary | ICD-10-CM | POA: Insufficient documentation

## 2020-03-01 DIAGNOSIS — R5383 Other fatigue: Secondary | ICD-10-CM | POA: Insufficient documentation

## 2020-03-01 LAB — CBC
HCT: 42.7 % (ref 38.5–50.0)
Hemoglobin: 14.5 g/dL (ref 13.2–17.1)
MCH: 30.9 pg (ref 27.0–33.0)
MCHC: 34 g/dL (ref 32.0–36.0)
MCV: 91 fL (ref 80.0–100.0)
MPV: 11 fL (ref 7.5–12.5)
Platelets: 253 10*3/uL (ref 140–400)
RBC: 4.69 10*6/uL (ref 4.20–5.80)
RDW: 12.9 % (ref 11.0–15.0)
WBC: 4.8 10*3/uL (ref 3.8–10.8)

## 2020-03-01 LAB — URIC ACID: Uric Acid, Serum: 5.5 mg/dL (ref 4.0–8.0)

## 2020-03-11 ENCOUNTER — Telehealth: Payer: Self-pay | Admitting: Neurology

## 2020-03-11 NOTE — Telephone Encounter (Signed)
Looking at chart, it does look like they scheduled him an appointment on 03/25/2020.

## 2020-03-11 NOTE — Telephone Encounter (Signed)
Patient left vm that he hasn't heard about podiatrist appt. Looks like referral sent 03/01/2020.

## 2020-03-25 ENCOUNTER — Encounter: Payer: Self-pay | Admitting: Podiatry

## 2020-03-25 ENCOUNTER — Ambulatory Visit (INDEPENDENT_AMBULATORY_CARE_PROVIDER_SITE_OTHER): Payer: BC Managed Care – PPO | Admitting: Podiatry

## 2020-03-25 ENCOUNTER — Ambulatory Visit (INDEPENDENT_AMBULATORY_CARE_PROVIDER_SITE_OTHER): Payer: BC Managed Care – PPO

## 2020-03-25 ENCOUNTER — Other Ambulatory Visit: Payer: Self-pay

## 2020-03-25 VITALS — BP 152/97 | HR 79 | Temp 97.2°F | Resp 16

## 2020-03-25 DIAGNOSIS — M779 Enthesopathy, unspecified: Secondary | ICD-10-CM

## 2020-03-25 DIAGNOSIS — M778 Other enthesopathies, not elsewhere classified: Secondary | ICD-10-CM | POA: Diagnosis not present

## 2020-03-25 DIAGNOSIS — M19071 Primary osteoarthritis, right ankle and foot: Secondary | ICD-10-CM | POA: Diagnosis not present

## 2020-03-25 DIAGNOSIS — M205X1 Other deformities of toe(s) (acquired), right foot: Secondary | ICD-10-CM

## 2020-03-25 NOTE — Progress Notes (Signed)
Subjective:   Patient ID: Dale Roberts, male   DOB: 49 y.o.   MRN: [id number]   HPI 49 year old male presents the office with concerns of pain to his right big toe.  Seems to been ongoing for about 2 years.  He was thoroughly seen by sports medicine and he was suspected to have arthritis, gout.  He does get pain has been on his feet a lot or with activity. He has been told that he has a bone spur. He has tried nitro patches which has not helped and he has also tried tumeric which has helped some. He is active but he has had to limit his activity due to the discomfort.    Review of Systems  All other systems reviewed and are negative.  Past Medical History:  Diagnosis Date  . Anxiety   . Sleep concern   . Stress headaches     Past Surgical History:  Procedure Laterality Date  . KNEE ARTHROSCOPY  2005  . LUNG SURGERY  2006   MRSA removed     Current Outpatient Medications:  .  ibuprofen (ADVIL) 800 MG tablet, Take 1 tablet (800 mg total) by mouth every 8 (eight) hours as needed., Disp: 90 tablet, Rfl: 1 .  sildenafil (VIAGRA) 100 MG tablet, TAKE 1 TABLET(100 MG) BY MOUTH DAILY AS NEEDED FOR ERECTILE DYSFUNCTION, Disp: 10 tablet, Rfl: 0  No Known Allergies       Objective:  Physical Exam  General: AAO x3, NAD  Dermatological: Skin is warm, dry and supple bilateral. Nails x 10 are well manicured; remaining integument appears unremarkable at this time. There are no open sores, no preulcerative lesions, no rash or signs of infection present.  Vascular: Dorsalis Pedis artery and Posterior Tibial artery pedal pulses are 2/4 bilateral with immedate capillary fill time. Pedal hair growth present. No varicosities and no lower extremity edema present bilateral. There is no pain with calf compression, swelling, warmth, erythema.   Neruologic: Grossly intact via light touch bilateral. Protective threshold with Semmes Wienstein monofilament intact to all pedal sites bilateral.     Musculoskeletal: There is decreased range of motion of the right first MPJ_mild crepitation with range of motion.  Dorsal spurring is evident.  Tenderness palpation gently on the first MPJ.  Adequate range of motion of the left side.  There is no other areas of discomfort identified at this time.  Muscular strength 5/5 in all groups tested bilateral.  Gait: Unassisted, Nonantalgic.       Assessment:   Hallux limitus, first MPJ capsulitis right foot     Plan:  -Treatment options discussed including all alternatives, risks, and complications -Etiology of symptoms were discussed -X-rays were obtained and reviewed with the patient.  I independently reviewed these with the patient.  There is moderate arthritic changes present at first MPJ. -We discussed both conservative as well as surgical treatment options.  At this time he wants to continue conservative treatment.  A steroid injection was performed today for the first MPJ.  Skin was cleaned with Betadine, alcohol.  A mixture of 1 cc Kenalog 10, 0.5 cc of Marcaine plain, 0.5 cc of lidocaine plain was infiltrated into around the first MPJ without complications.  Postinjection care discussed. -We will check orthotic coverage.  I think good arch support with a Morton's extension will be helpful.  We will also mail him a graphite insert.  Vivi Barrack DPM

## 2020-03-25 NOTE — Telephone Encounter (Signed)
Patient sent a message stating that he was having nausea and itching. He received a steroid injection earlier today. I called the patient. He is not having any swelling, chest pain, Shortness of breath or other issues. He said it is not anything major. I recommended him to take a benadryl and if he has any other issues he needs to go the ER. Verbalized understanding.

## 2020-03-25 NOTE — Progress Notes (Signed)
   Subjective:    Patient ID: Dale Roberts, male    DOB: [birth year], 49 y.o.   MRN: [id number]  HPI    Review of Systems  All other systems reviewed and are negative.      Objective:   Physical Exam        Assessment & Plan:

## 2020-03-29 ENCOUNTER — Telehealth: Payer: Self-pay | Admitting: *Deleted

## 2020-03-29 NOTE — Telephone Encounter (Signed)
Nolon Nations to order a carbon insert 13 mens size. Misty Bundick

## 2020-03-29 NOTE — Telephone Encounter (Signed)
-----   Message from Vivi Barrack, DPM sent at 03/27/2020  8:03 AM EDT ----- We need to mail him a size 13 graphite insert. I saw him in [place] on Friday and we don't have them there.

## 2020-04-21 ENCOUNTER — Telehealth: Payer: Self-pay | Admitting: Podiatry

## 2020-04-21 NOTE — Telephone Encounter (Signed)
Left message for pt that orthotics are covered @ 100% after deductible and pt has not met deductible.. Also included it is never a guarantee of payment per insurance company and to call for further information.

## 2020-04-26 ENCOUNTER — Ambulatory Visit (INDEPENDENT_AMBULATORY_CARE_PROVIDER_SITE_OTHER): Payer: BC Managed Care – PPO | Admitting: Sports Medicine

## 2020-04-26 ENCOUNTER — Other Ambulatory Visit: Payer: Self-pay

## 2020-04-26 DIAGNOSIS — M7702 Medial epicondylitis, left elbow: Secondary | ICD-10-CM

## 2020-04-26 NOTE — Progress Notes (Signed)
    Procedures performed today:    Procedure: Real-time Ultrasound Guided injection of the left common flexor tendon origin Device: Samsung HS60  Verbal informed consent obtained.  Time-out conducted.  Noted no overlying erythema, induration, or other signs of local infection.  Skin prepped in a sterile fashion.  Local anesthesia: Topical Ethyl chloride.  With sterile technique and under real time ultrasound guidance: 1 cc Kenalog 40, 1 cc lidocaine, 1 cc bupivacaine injected easily Completed without difficulty  Pain immediately resolved suggesting accurate placement of the medication.  Advised to call if fevers/chills, erythema, induration, drainage, or persistent bleeding.  Images permanently stored and available for review in the ultrasound unit.  Impression: Technically successful ultrasound guided injection.  Independent interpretation of notes and tests performed by another provider:   None.  Brief History, Exam, Impression, and Recommendations:    Medial epicondylitis, left Dale Roberts returns, he is a pleasant 49 year old male, he does jujitsu. Approximately 3 years ago we did a right medial epicondylar injection with good relief for several months. He is having only mild residual pain on the right, this could certainly be a target for PRP in the future if needed. He is now having symptoms on the left side, tenderness over the medial epicondyle, today I performed a common flexor tendon injection. Adding rehab exercises, avoid jujitsu for 4 days, return to see me in 1 month, we can certainly consider PRP in the future if needed.    ___________________________________________ Dale Roberts. Benjamin Stain, M.D., ABFM., CAQSM. Primary Care and Sports Medicine [facility]  Adjunct Instructor of Family Medicine  [employer]

## 2020-04-26 NOTE — Assessment & Plan Note (Addendum)
Dale Roberts returns, he is a pleasant 49 year old male, he does jujitsu. Approximately 3 years ago we did a right medial epicondylar injection with good relief for several months. He is having only mild residual pain on the right, this could certainly be a target for PRP in the future if needed. He is now having symptoms on the left side, tenderness over the medial epicondyle, today I performed a common flexor tendon injection. Adding rehab exercises, avoid jujitsu for 4 days, return to see me in 1 month, we can certainly consider PRP in the future if needed.

## 2020-04-26 NOTE — Patient Instructions (Signed)
 Platelet-rich plasma is used in musculoskeletal medicine to focus your own body's ability to heal. It has several well-done published randomized control trials (RCT) which demonstrate both its effectiveness and safety in many musculoskeletal conditions, including osteoarthritis, tendinopathies, and damaged vertebral discs. PRP has been in clinical use since the 1990's. Many people know that platelets form a clot if there is a cut in the skin. It turns out that platelets do not only form a clot, they also start the body's own repair process. When platelets activate to form a clot, they also release alpha granules which have hundreds of chemical messengers in them that initiate and organize repair to the damaged tissue. Precisely placing PRP into the site of injury will initiate the healing process by activating on the damaged cartilage or tendon. This is an inflammatory process, and inflammation is the vital first phase of Healing.  What to expect and how to prepare for PRP  . 2 weeks prior to the procedure: depending on the procedure, you may need to arrange for a driver to bring you home. IF you are having a lower extremity procedure, we can provide crutches as needed.  . 7 days prior to the procedure: Stop taking anti-inflammatory drugs like ibuprofen, Naprosyn, Celebrex, or Meloxicam. Let your doctor know if you have been taking prednisone or other corticosteroids in the last month.  . The day before the procedure: thoroughly shower and clean your skin.   . The day of the procedure: Wear loose-fitting clothing like sweatpants or shorts. If you are having an upper body procedure wear a top that can button or zip up.  PRP will initiate healing and a productive inflammation, and PRP therapy will make the body part treated sore for 4 days to two weeks. Anti-inflammatory drugs (i.e. ibuprofen, Naprosyn, Celebrex) and corticosteroids such as prednisone can blunt or stop this process, so  it is important to not take any anti-inflammatory drugs for 7 days before getting PRP therapy, or for at least three weeks after PRP therapy. Corticosteroid injections can blunt inflammation for 30 days, so let us know if you have had one recently. Depending on the body part injected, you may be in a sling or on crutches for several days. Just like wringing out a wet dishcloth, if you load or tense a tendon or ligament that has just been injected with PRP, some of the PRP injected will squish out. By keeping the body part treated relaxed by using a sling (for the shoulder or arm) or crutches (for hips and legs) for a few days, the PRP can bind in place and do its job.   You may need a driver to bring you home.  Tobacco?nicotine is a potent toxin and its use constricts small blood vessels which are needed for tissue repair.  Tobacco/nicotine use will limit the effectiveness of any treatment and stopping tobacco use is one of the single  greatest actions you can take to improve your health. Avoid toxins like alcohol, which inhibits and depresses the cells needed for tissue repair.  What happens during the PRP procedure?  Platelet rich plasma is made by taking some of your blood and performing a two-stage centrifuge process on it to concentrate the PRP. First, your blood is drawn into a syringe with a small amount of anti-coagulant in it (this is to keep the blood from clotting during this process). The amount of blood drawn is usually about 30-60 milliliters, depending on how much PRP is needed for   the treatment.  (There are 355 milliliters in a 12-ounce soda can for comparison).  Then the blood is transferred in a sterile fashion into a centrifuge tube. It is then centrifuged for the first cycle where the red blood cells are isolated and discarded. In the second centrifuge cycle, the platelet-rich fraction of the remaining plasma is concentrated and placed in a syringe. The skin at the  injection site is numbed with a small amount of topical cooling spray. Dr Dale Roberts will then precisely inject the PRP into the injury site using ultrasound guidance.  What to do after your procedure  I will give you specific medicine to control any discomfort you may have after the procedure. Avoid NSAIDs like ibuprofen. Acetaminophen can be used for mild pain.  Depending on the part of the body treated, usually you will be placed in a sling or on crutches for 1 to 3 days. Do your best not to tense or load the treated area during this time. After 3 days, unless otherwise instructed, the treated body part should be used and slowly moved through its full range of motion. It will be sore, but you will not be doing damage by moving it, in fact it needs to move to heal. If you were on crutches for a period of time, walking is ok once you are off the crutches. For now, avoid activities that specifically hurt you before being treated. Exercise is vital to good health and finding a way to cross train around your injury is important not only for your physical health, but for your mental health as well. Ask me about cross training options for your injury. Some brief (10 minutes or less) period of heat or ice therapy will not hurt the therapy, but it is not required. Usually, depending on the initial injury, physical therapy is started from two weeks to four weeks after injection. Improvements in pain and function should be expected from 8 weeks to 12 weeks after injection and some injuries may require more than one treatment.    ___________________________________________ Dale Roberts, M.D., ABFM., CAQSM. Primary Care and Sports Medicine [facility]  Adjunct Professor of Family Medicine  [employer]   

## 2020-05-20 ENCOUNTER — Ambulatory Visit: Payer: BC Managed Care – PPO | Admitting: Podiatry

## 2020-05-24 ENCOUNTER — Ambulatory Visit: Payer: BC Managed Care – PPO | Admitting: Sports Medicine

## 2020-08-17 ENCOUNTER — Ambulatory Visit: Payer: BC Managed Care – PPO | Admitting: Sports Medicine

## 2020-09-23 ENCOUNTER — Encounter: Payer: Self-pay | Admitting: Medical-Surgical

## 2020-09-23 ENCOUNTER — Other Ambulatory Visit: Payer: Self-pay

## 2020-09-23 ENCOUNTER — Ambulatory Visit (INDEPENDENT_AMBULATORY_CARE_PROVIDER_SITE_OTHER): Payer: BC Managed Care – PPO | Admitting: Medical-Surgical

## 2020-09-23 ENCOUNTER — Ambulatory Visit (INDEPENDENT_AMBULATORY_CARE_PROVIDER_SITE_OTHER): Payer: BC Managed Care – PPO

## 2020-09-23 VITALS — BP 141/89 | HR 81 | Temp 97.6°F | Ht 72.0 in | Wt 244.9 lb

## 2020-09-23 DIAGNOSIS — M5441 Lumbago with sciatica, right side: Secondary | ICD-10-CM | POA: Diagnosis not present

## 2020-09-23 DIAGNOSIS — M545 Low back pain, unspecified: Secondary | ICD-10-CM | POA: Diagnosis not present

## 2020-09-23 DIAGNOSIS — R531 Weakness: Secondary | ICD-10-CM | POA: Diagnosis not present

## 2020-09-23 DIAGNOSIS — M5137 Other intervertebral disc degeneration, lumbosacral region: Secondary | ICD-10-CM | POA: Diagnosis not present

## 2020-09-23 DIAGNOSIS — M5442 Lumbago with sciatica, left side: Secondary | ICD-10-CM | POA: Diagnosis not present

## 2020-09-23 MED ORDER — KETOROLAC TROMETHAMINE 60 MG/2ML IM SOLN
60.0000 mg | Freq: Once | INTRAMUSCULAR | Status: AC
  Administered 2020-09-23: 60 mg via INTRAMUSCULAR

## 2020-09-23 MED ORDER — PREDNISONE 50 MG PO TABS: 50.0000 mg | ORAL_TABLET | Freq: Every day | ORAL | 0 refills | Status: DC

## 2020-09-23 MED ORDER — HYDROCODONE-ACETAMINOPHEN 5-325 MG PO TABS: 1.0000 | ORAL_TABLET | Freq: Two times a day (BID) | ORAL | 0 refills | Status: AC | PRN

## 2020-09-23 NOTE — Progress Notes (Signed)
Subjective:    CC: low back pain  HPI: Pleasant 49 year old male presenting for evaluation fo 3-4 weeks of severe midline low back pain that radiates to the left and right intermittently. He is a very active gentleman that participates in several sports including jujitsu and taekwondo.  Since his low back pain started, he has stopped doing those physical activities.  He has been treating at home with stretching, ibuprofen/Aleve, Tylenol, heat, and ice.  He has also used [employer] Scientist, clinical (histocompatibility and immunogenetics) white 6gms which is an herbal supplement, reports this helps for about 4-5 hours.  He is having tremendous difficulty sleeping due to an inability to find a comfortable position.  His most comfortable position so far has been in a hot bath with his legs elevated.  Describes the pain as throbbing and aching with intermittent sharp stabbing sensations.  He has had some lower extremity numbness/tingling intermittently involving both legs.  Denies incontinence and saddle paresthesias but does endorse weakness of his hips and thighs.  Has had to start using a walking cane or a walker to rise from a low worse sitting position.  Admits to having several injections for various joint aches and pains in the past couple of years but this is unlike any pain that he is ever had before.  Denies recent known injury/trauma.  I reviewed the past medical history, family history, social history, surgical history, and allergies today and no changes were needed.  Please see the problem list section below in epic for further details.  Past Medical History: Past Medical History:  Diagnosis Date  . Anxiety   . Sleep concern   . Stress headaches    Past Surgical History: Past Surgical History:  Procedure Laterality Date  . KNEE ARTHROSCOPY  2005  . LUNG SURGERY  2006   MRSA removed   Social History: Social History   Socioeconomic History  . Marital status: Married    Spouse name: Not on file  . Number of children: Not on file   . Years of education: Not on file  . Highest education level: Not on file  Occupational History  . Not on file  Tobacco Use  . Smoking status: Never Smoker  . Smokeless tobacco: Never Used  Substance and Sexual Activity  . Alcohol use: Yes    Alcohol/week: 2.0 standard drinks    Types: 2 Standard drinks or equivalent per week  . Drug use: No  . Sexual activity: Yes    Partners: Female  Other Topics Concern  . Not on file  Social History Narrative  . Not on file   Social Determinants of Health   Financial Resource Strain:   . Difficulty of Paying Living Expenses: Not on file  Food Insecurity:   . Worried About Programme researcher, broadcasting/film/video in the Last Year: Not on file  . Ran Out of Food in the Last Year: Not on file  Transportation Needs:   . Lack of Transportation (Medical): Not on file  . Lack of Transportation (Non-Medical): Not on file  Physical Activity:   . Days of Exercise per Week: Not on file  . Minutes of Exercise per Session: Not on file  Stress:   . Feeling of Stress : Not on file  Social Connections:   . Frequency of Communication with Friends and Family: Not on file  . Frequency of Social Gatherings with Friends and Family: Not on file  . Attends Religious Services: Not on file  . Active Member of Clubs or  Organizations: Not on file  . Attends Banker Meetings: Not on file  . Marital Status: Not on file   Family History: History reviewed. No pertinent family history. Allergies: No Known Allergies Medications: See med rec.  Review of Systems: See HPI for pertinent positives and negatives.   Objective:    General: Well Developed, well nourished, and in no acute distress.  Neuro: Alert and oriented x3.  HEENT: Normocephalic, atraumatic.  Skin: Warm and dry. Cardiac: Regular rate and rhythm, no murmurs rubs or gallops, no lower extremity edema.  Respiratory: Clear to auscultation bilaterally. Not using accessory muscles, speaking in full  sentences. MSK: Midline tenderness of the lumbar spine extending to both the left and right.  Positive Faber bilaterally.  Straight leg test equivocal with mild discomfort at approximately 60 degrees hip flexion bilaterally but no severe pain. Mild difficulty rising from low seated position. Knee flexion/extension strength 5/5. Right hip flexion 5/5, left hip flexion 4/5. Bilateral hip extension   Impression and Recommendations:    1. Acute midline low back pain with bilateral sciatica Getting lumbar spine x-rays today.  Toradol 60 mg IM x1 in office for pain.  Sending in 50 mg prednisone x5 days.  Also sending hydrocodone one tab twice daily as needed for moderate to severe pain.  Advise continuing conservative treatment with ibuprofen, heat, ice, and massage.  Recommend avoiding strenuous activities/stretching. - DG Lumbar Spine Complete; Future - ketorolac (TORADOL) injection 60 mg - MR Lumbar Spine Wo Contrast; Future  2. Rapidly progressive weakness Very concerning for this extremely active and fit gentleman to be requiring use of a cane/walker to rise from a low seated position due to muscle weakness.  With this we will go ahead and get a stat MRI.  - MR Lumbar Spine Wo Contrast; Future  Note: Stat MRI ordered and approved through insurance.  Scheduled the procedure for today at 1:45 PM.  Patient was contacted with this information and he reports he is unable to go today because he has to work.  Notes that he will have to go sometime next week.  Information was provided to the patient regarding how to get this scheduled.  Patient verbalized understanding and will call for an appointment next week.  Return in about 1 week (around 09/30/2020) for low back pain. ___________________________________________ Thayer Ohm, DNP, APRN, FNP-BC Primary Care and Sports Medicine [facility] [place]

## 2021-03-07 ENCOUNTER — Encounter: Payer: Self-pay | Admitting: Physician Assistant

## 2021-03-07 ENCOUNTER — Ambulatory Visit (INDEPENDENT_AMBULATORY_CARE_PROVIDER_SITE_OTHER): Payer: Managed Care, Other (non HMO) | Admitting: Physician Assistant

## 2021-03-07 ENCOUNTER — Other Ambulatory Visit: Payer: Self-pay | Admitting: Neurology

## 2021-03-07 ENCOUNTER — Other Ambulatory Visit: Payer: Self-pay

## 2021-03-07 ENCOUNTER — Other Ambulatory Visit: Payer: Self-pay | Admitting: Physician Assistant

## 2021-03-07 VITALS — BP 124/96 | HR 96 | Ht 72.0 in | Wt 228.0 lb

## 2021-03-07 DIAGNOSIS — M5416 Radiculopathy, lumbar region: Secondary | ICD-10-CM

## 2021-03-07 DIAGNOSIS — M7751 Other enthesopathy of right foot: Secondary | ICD-10-CM

## 2021-03-07 DIAGNOSIS — M5442 Lumbago with sciatica, left side: Secondary | ICD-10-CM | POA: Insufficient documentation

## 2021-03-07 DIAGNOSIS — Z125 Encounter for screening for malignant neoplasm of prostate: Secondary | ICD-10-CM | POA: Diagnosis not present

## 2021-03-07 DIAGNOSIS — Z0000 Encounter for general adult medical examination without abnormal findings: Secondary | ICD-10-CM | POA: Diagnosis not present

## 2021-03-07 DIAGNOSIS — E785 Hyperlipidemia, unspecified: Secondary | ICD-10-CM | POA: Diagnosis not present

## 2021-03-07 DIAGNOSIS — Z131 Encounter for screening for diabetes mellitus: Secondary | ICD-10-CM

## 2021-03-07 DIAGNOSIS — M5441 Lumbago with sciatica, right side: Secondary | ICD-10-CM | POA: Insufficient documentation

## 2021-03-07 DIAGNOSIS — M205X1 Other deformities of toe(s) (acquired), right foot: Secondary | ICD-10-CM

## 2021-03-07 DIAGNOSIS — M5136 Other intervertebral disc degeneration, lumbar region: Secondary | ICD-10-CM

## 2021-03-07 DIAGNOSIS — N529 Male erectile dysfunction, unspecified: Secondary | ICD-10-CM

## 2021-03-07 MED ORDER — HYDROCODONE-ACETAMINOPHEN 5-325 MG PO TABS: 1.0000 | ORAL_TABLET | Freq: Three times a day (TID) | ORAL | 0 refills | Status: DC | PRN

## 2021-03-07 MED ORDER — MELOXICAM 15 MG PO TABS: 15.0000 mg | ORAL_TABLET | Freq: Every day | ORAL | 1 refills | Status: DC

## 2021-03-07 MED ORDER — CYCLOBENZAPRINE HCL 10 MG PO TABS: 10.0000 mg | ORAL_TABLET | Freq: Three times a day (TID) | ORAL | 0 refills | Status: DC | PRN

## 2021-03-07 MED ORDER — PREDNISONE 50 MG PO TABS: ORAL_TABLET | ORAL | 0 refills | Status: DC

## 2021-03-07 MED ORDER — KETOROLAC TROMETHAMINE 60 MG/2ML IM SOLN
60.0000 mg | Freq: Once | INTRAMUSCULAR | Status: AC
  Administered 2021-03-07: 60 mg via INTRAMUSCULAR

## 2021-03-07 NOTE — Progress Notes (Signed)
Subjective:    Patient ID: Dale Roberts, male    DOB: 01-Dec-1971, 50 y.o.   MRN: [id number]  HPI  Patient is a 50 year old male who presents to the clinic for complete physical.  Patient does have known right toe pain and has seen podiatry before.  He left a message and wonders if he needs new referral.  He is ready to take action for his right toe persistent pain.  He continues to have intermittent low back pain that radiates to both sides but recently more to the right. Ongoing pain for years.  Denies any bowel or bladder dysfunction, saddle anesthesia, leg weakness. Taking ibuprofen some which does help. Shot that was given in October did help a lot but pain always comes back. Known lumbar DDD.    .. Active Ambulatory Problems    Diagnosis Date Noted  . MRSA INFECTION 02/27/2008  . Testicular pain 10/10/2012  . Epididymitis, right 10/10/2012  . Ingrown left greater toenail 05/08/2019  . Erectile dysfunction 05/08/2019  . Hyperlipidemia with target LDL less than 130 05/08/2019  . Trouble in sleeping 03/01/2020  . No energy 03/01/2020  . Great toe pain, right 03/01/2020  . Acute midline low back pain with bilateral sciatica 03/07/2021  . Capsulitis of toe of right foot 03/07/2021   Resolved Ambulatory Problems    Diagnosis Date Noted  . Infectious diarrhea(009.2) 12/05/2007  . Carbuncle and furuncle of unspecified site 02/27/2008  . Medial epicondylitis, left 06/25/2017   Past Medical History:  Diagnosis Date  . Anxiety   . Sleep concern   . Stress headaches      Review of Systems  All other systems reviewed and are negative.      Objective:   Physical Exam BP (!) 124/96   Pulse 96   Ht 6' (1.829 m)   Wt 228 lb (103.4 kg)   SpO2 100%   BMI 30.92 kg/m   General Appearance:    Alert, cooperative, no distress, appears stated age  Head:    Normocephalic, without obvious abnormality, atraumatic  Eyes:    PERRL, conjunctiva/corneas clear, EOM's intact, fundi     benign, both eyes       Ears:    Normal TM's and external ear canals, both ears  Nose:   Nares normal, septum midline, mucosa normal, no drainage    or sinus tenderness  Throat:   Lips, mucosa, and tongue normal; teeth and gums normal  Neck:   Supple, symmetrical, trachea midline, no adenopathy;       thyroid:  No enlargement/tenderness/nodules; no carotid   bruit or JVD  Back:     Symmetric, no curvature, ROM normal, no CVA tenderness  Lungs:     Clear to auscultation bilaterally, respirations unlabored  Chest wall:    No tenderness or deformity  Heart:    Regular rate and rhythm, S1 and S2 normal, no murmur, rub   or gallop  Abdomen:     Soft, non-tender, bowel sounds active all four quadrants,    no masses, no organomegaly        Extremities:    5/5 upper and lower ext strength, no edema. Tenderness over L5/S1 lumbar spine and paraspinal muscles more to the right, positive SLR to right at 45 degrees, swollen bunion right foot.   Pulses:   2+ and symmetric all extremities  Skin:   Skin color, texture, turgor normal, no rashes or lesions  Lymph nodes:   Cervical, supraclavicular, and axillary nodes  normal  Neurologic:   CNII-XII intact. Normal strength, sensation and reflexes      throughout        .Marland Kitchen Depression screen [facility] 2/9 03/07/2021 02/29/2020  Decreased Interest 0 3  Down, Depressed, Hopeless 0 0  PHQ - 2 Score 0 3  Altered sleeping - 3  Tired, decreased energy - 3  Change in appetite - 0  Feeling bad or failure about yourself  - 0  Trouble concentrating - 1  Moving slowly or fidgety/restless - 0  Suicidal thoughts - 0  PHQ-9 Score - 10  Difficult doing work/chores - Somewhat difficult   .Marland Kitchen GAD 7 : Generalized Anxiety Score 02/29/2020  Nervous, Anxious, on Edge 3  Control/stop worrying 1  Worry too much - different things 3  Trouble relaxing 3  Restless 1  Easily annoyed or irritable 2  Afraid - awful might happen 0  Total GAD 7 Score 13  Anxiety Difficulty  Somewhat difficult    .Marland Kitchen  RO-AUA SYMPTOM    Row Name 03/07/21 0900         During the last Month   Sensation of Bladder not Empty Not at all     Urinate<2 hours after last Not at all     Mult. stop/start when voiding Not at all     Difficult to postpone voiding Not at all     Weak urinary stream Not at all     Push/strain to begin urination Not at all     Times per night up to urinate Not at all           OTHER   Total Score 0            Assessment & Plan:  Marland KitchenMarland KitchenBoluwatife was seen today for annual exam.  Diagnoses and all orders for this visit:  Routine physical examination -     Lipid Panel w/reflex Direct LDL -     COMPLETE METABOLIC PANEL WITH GFR -     PSA  Hyperlipidemia with target LDL less than 130 -     Lipid Panel w/reflex Direct LDL  Screening for diabetes mellitus -     COMPLETE METABOLIC PANEL WITH GFR  Prostate cancer screening -     PSA -     ketorolac (TORADOL) injection 60 mg  Lumbar radiculitis -     cyclobenzaprine (FLEXERIL) 10 MG tablet; Take 1 tablet (10 mg total) by mouth 3 (three) times daily as needed for muscle spasms. -     HYDROcodone-acetaminophen (NORCO/VICODIN) 5-325 MG tablet; Take 1 tablet by mouth every 8 (eight) hours as needed for up to 5 days for moderate pain. -     ketorolac (TORADOL) injection 60 mg -     predniSONE (DELTASONE) 50 MG tablet; Take one tablet daily.  Hallux limitus of right foot -     meloxicam (MOBIC) 15 MG tablet; Take 1 tablet (15 mg total) by mouth daily. -     Ambulatory referral to Podiatry  Capsulitis of toe of right foot -     meloxicam (MOBIC) 15 MG tablet; Take 1 tablet (15 mg total) by mouth daily. -     Ambulatory referral to Podiatry   .Marland Kitchen Discussed 150 minutes of exercise a week.  Encouraged vitamin D 1000 units and Calcium 1300mg  or 4 servings of dairy a day.  Fasting labs ordered.  AUA great at 0. PSA ordered.  Declined Tdap, covid, flu, shingrix.  Declined colonoscopy. He is aware of  risk  of colon cancer.   New referral to podiatry to continue management.   Known lumbar DDD at L5 and S1.  Would like pt to get MRI of lumbar spine. Pt cannot afford.  Likely some disc herniation to cause the radicular symptoms.  Burst of prednisone started.  Toradol 60mg  IM given in office today.  Discussed PT with and Nadine Counts.  Consider chiropractor care.  Tens unit, massage, icy hot patches, good lifting technique, working on core. Sent flexeril as needed and mobic once a day.  norco small quantity given for break through pain.  Nida Boatman.PDMP reviewed during this encounter.  Consider follow up with sports medicine.

## 2021-03-07 NOTE — Telephone Encounter (Signed)
This isn't on his med list, did you discuss at appt?

## 2021-03-07 NOTE — Patient Instructions (Addendum)
mobic daily. Flexeril as needed.  Prednisone for 5 days.  norco for breakthrough pain.  Consider referral and MRI.  Consider chiropractor at [facility] in [place].    Health Maintenance, Male Adopting a healthy lifestyle and getting preventive care are important in promoting health and wellness. Ask your health care provider about:  The right schedule for you to have regular tests and exams.  Things you can do on your own to prevent diseases and keep yourself healthy. What should I know about diet, weight, and exercise? Eat a healthy diet  Eat a diet that includes plenty of vegetables, fruits, low-fat dairy products, and lean protein.  Do not eat a lot of foods that are high in solid fats, added sugars, or sodium.   Maintain a healthy weight Body mass index (BMI) is a measurement that can be used to identify possible weight problems. It estimates body fat based on height and weight. Your health care provider can help determine your BMI and help you achieve or maintain a healthy weight. Get regular exercise Get regular exercise. This is one of the most important things you can do for your health. Most adults should:  Exercise for at least 150 minutes each week. The exercise should increase your heart rate and make you sweat (moderate-intensity exercise).  Do strengthening exercises at least twice a week. This is in addition to the moderate-intensity exercise.  Spend less time sitting. Even light physical activity can be beneficial. Watch cholesterol and blood lipids Have your blood tested for lipids and cholesterol at 52 years of age, then have this test every 5 years. You may need to have your cholesterol levels checked more often if:  Your lipid or cholesterol levels are high.  You are older than 52 years of age.  You are at high risk for heart disease. What should I know about cancer screening? Many types of cancers can be detected early and may often be prevented. Depending on  your health history and family history, you may need to have cancer screening at various ages. This may include screening for:  Colorectal cancer.  Prostate cancer.  Skin cancer.  Lung cancer. What should I know about heart disease, diabetes, and high blood pressure? Blood pressure and heart disease  High blood pressure causes heart disease and increases the risk of stroke. This is more likely to develop in people who have high blood pressure readings, are of African descent, or are overweight.  Talk with your health care provider about your target blood pressure readings.  Have your blood pressure checked: ? Every 3-5 years if you are 36-32 years of age. ? Every year if you are 25 years old or older.  If you are between the ages of 80 and 6 and are a current or former smoker, ask your health care provider if you should have a one-time screening for abdominal aortic aneurysm (AAA). Diabetes Have regular diabetes screenings. This checks your fasting blood sugar level. Have the screening done:  Once every three years after age 71 if you are at a normal weight and have a low risk for diabetes.  More often and at a younger age if you are overweight or have a high risk for diabetes. What should I know about preventing infection? Hepatitis B If you have a higher risk for hepatitis B, you should be screened for this virus. Talk with your health care provider to find out if you are at risk for hepatitis B infection. Hepatitis C Blood  testing is recommended for:  Everyone born from [birth year] through 1967.  Anyone with known risk factors for hepatitis C. Sexually transmitted infections (STIs)  You should be screened each year for STIs, including gonorrhea and chlamydia, if: ? You are sexually active and are younger than 52 years of age. ? You are older than 52 years of age and your health care provider tells you that you are at risk for this type of infection. ? Your sexual activity has  changed since you were last screened, and you are at increased risk for chlamydia or gonorrhea. Ask your health care provider if you are at risk.  Ask your health care provider about whether you are at high risk for HIV. Your health care provider may recommend a prescription medicine to help prevent HIV infection. If you choose to take medicine to prevent HIV, you should first get tested for HIV. You should then be tested every 3 months for as long as you are taking the medicine. Follow these instructions at home: Lifestyle  Do not use any products that contain nicotine or tobacco, such as cigarettes, e-cigarettes, and chewing tobacco. If you need help quitting, ask your health care provider.  Do not use street drugs.  Do not share needles.  Ask your health care provider for help if you need support or information about quitting drugs. Alcohol use  Do not drink alcohol if your health care provider tells you not to drink.  If you drink alcohol: ? Limit how much you have to 0-2 drinks a day. ? Be aware of how much alcohol is in your drink. In the [place] one drink equals one 12 oz bottle of beer (355 mL), one 5 oz glass of wine (148 mL), or one 1 oz glass of hard liquor (44 mL). General instructions  Schedule regular health, dental, and eye exams.  Stay current with your vaccines.  Tell your health care provider if: ? You often feel depressed. ? You have ever been abused or do not feel safe at home. Summary  Adopting a healthy lifestyle and getting preventive care are important in promoting health and wellness.  Follow your health care provider's instructions about healthy diet, exercising, and getting tested or screened for diseases.  Follow your health care provider's instructions on monitoring your cholesterol and blood pressure. This information is not intended to replace advice given to you by your health care provider. Make sure you discuss any questions you have with your  health care provider. Document Revised: 11/26/2018 Document Reviewed: 11/26/2018 Elsevier Patient Education  2021 [employer].

## 2021-03-07 NOTE — Telephone Encounter (Signed)
Patient left vm stating medication not available at [facility] in [place] and wants sent to [facility] in [facility]. I called [facility] in [place] and cancelled RX for Hydrocodone. Can you sign RX for Hydrocodone?

## 2021-03-08 MED ORDER — SILDENAFIL CITRATE 100 MG PO TABS: ORAL_TABLET | ORAL | 11 refills | Status: DC

## 2021-03-09 LAB — COMPLETE METABOLIC PANEL WITH GFR
AG Ratio: 1.5 (calc) (ref 1.0–2.5)
ALT: 31 U/L (ref 9–46)
AST: 19 U/L (ref 10–35)
Albumin: 4.2 g/dL (ref 3.6–5.1)
Alkaline phosphatase (APISO): 43 U/L (ref 35–144)
BUN: 12 mg/dL (ref 7–25)
CO2: 28 mmol/L (ref 20–32)
Calcium: 9.3 mg/dL (ref 8.6–10.3)
Chloride: 104 mmol/L (ref 98–110)
Creat: 1.05 mg/dL (ref 0.70–1.33)
GFR, Est African American: 95 mL/min/{1.73_m2} (ref >=60)
GFR, Est Non African American: 82 mL/min/{1.73_m2} (ref >=60)
Globulin: 2.8 g/dL (calc) (ref 1.9–3.7)
Glucose, Bld: 85 mg/dL (ref 65–99)
Potassium: 4.6 mmol/L (ref 3.5–5.3)
Sodium: 139 mmol/L (ref 135–146)
Total Bilirubin: 0.4 mg/dL (ref 0.2–1.2)
Total Protein: 7 g/dL (ref 6.1–8.1)

## 2021-03-09 LAB — PSA: PSA: 0.57 ng/mL (ref <=4.0)

## 2021-03-09 LAB — LIPID PANEL W/REFLEX DIRECT LDL
Cholesterol: 229 mg/dL — ABNORMAL HIGH (ref <200)
HDL: 27 mg/dL — ABNORMAL LOW (ref >=40)
LDL Cholesterol (Calc): 173 mg/dL (calc) — ABNORMAL HIGH
Non-HDL Cholesterol (Calc): 202 mg/dL (calc) — ABNORMAL HIGH (ref <130)
Total CHOL/HDL Ratio: 8.5 (calc) — ABNORMAL HIGH (ref <5.0)
Triglycerides: 144 mg/dL (ref <150)

## 2021-03-09 MED ORDER — HYDROCODONE-ACETAMINOPHEN 5-325 MG PO TABS: 1.0000 | ORAL_TABLET | Freq: Three times a day (TID) | ORAL | 0 refills | Status: AC | PRN

## 2021-03-09 NOTE — Telephone Encounter (Signed)
Handled in other MyChart message.

## 2021-03-09 NOTE — Progress Notes (Signed)
Deckard,   LDL, bad cholesterol, elevated.  HDL, good cholesterol, decreased.  TG ok.  Per visit you eat pretty healthy so you are likely not going to get to cholesterol goal without medication. I would suggest medications but I know you have strong feelings about certain medications. What are your thoughts on cholesterol medication? Would you consider?   Kidney, liver, glucose look great.  PSA is normal range.

## 2021-03-13 ENCOUNTER — Other Ambulatory Visit: Payer: Self-pay | Admitting: Physician Assistant

## 2021-03-13 MED ORDER — SILDENAFIL CITRATE 20 MG PO TABS: ORAL_TABLET | ORAL | 0 refills | Status: DC

## 2021-03-15 ENCOUNTER — Encounter: Payer: Self-pay | Admitting: Physician Assistant

## 2021-04-11 ENCOUNTER — Ambulatory Visit: Payer: Managed Care, Other (non HMO) | Admitting: Podiatry

## 2021-07-13 ENCOUNTER — Encounter: Payer: Self-pay | Admitting: Physician Assistant

## 2021-07-13 DIAGNOSIS — M5136 Other intervertebral disc degeneration, lumbar region: Secondary | ICD-10-CM

## 2021-11-21 ENCOUNTER — Encounter: Payer: Self-pay | Admitting: Family Medicine

## 2021-11-21 ENCOUNTER — Other Ambulatory Visit: Payer: Self-pay

## 2021-11-21 ENCOUNTER — Ambulatory Visit: Payer: Managed Care, Other (non HMO) | Admitting: Family Medicine

## 2021-11-21 DIAGNOSIS — H60541 Acute eczematoid otitis externa, right ear: Secondary | ICD-10-CM | POA: Insufficient documentation

## 2021-11-21 MED ORDER — NEOMYCIN-POLYMYXIN-HC 3.5-10000-1 OT SOLN: 4.0000 [drp] | Freq: Four times a day (QID) | OTIC | 0 refills | Status: AC

## 2021-11-21 NOTE — Assessment & Plan Note (Signed)
Starting Cortisporin drops.  Instructed to contact clinic if symptoms are not improving.

## 2021-11-21 NOTE — Progress Notes (Signed)
Dale Roberts - 50 y.o. male MRN [id number]  Date of birth: [birth year]  Subjective Chief Complaint  Patient presents with   Ear Pain    HPI Dale Roberts is a 50 year old male here today with complaint of right ear irritation.  He has noticed tickling sensation in his right ear over the past few days.  Sometimes feels like something is moving inside of his ear.  He does have some pain that radiates into his jaw occasionally.  He denies fever, chills, bleeding or drainage from the ear.  ROS:  A comprehensive ROS was completed and negative except as noted per HPI  No Known Allergies  Past Medical History:  Diagnosis Date   Anxiety    Sleep concern    Stress headaches     Past Surgical History:  Procedure Laterality Date   KNEE ARTHROSCOPY  2005   LUNG SURGERY  2006   MRSA removed    Social History   Socioeconomic History   Marital status: Married    Spouse name: Not on file   Number of children: Not on file   Years of education: Not on file   Highest education level: Not on file  Occupational History   Not on file  Tobacco Use   Smoking status: Never   Smokeless tobacco: Never  Substance and Sexual Activity   Alcohol use: Yes    Alcohol/week: 2.0 standard drinks    Types: 2 Standard drinks or equivalent per week   Drug use: No   Sexual activity: Yes    Partners: Female  Other Topics Concern   Not on file  Social History Narrative   Not on file   Social Determinants of Health   Financial Resource Strain: Not on file  Food Insecurity: Not on file  Transportation Needs: Not on file  Physical Activity: Not on file  Stress: Not on file  Social Connections: Not on file    History reviewed. No pertinent family history.  Health Maintenance  Topic Date Due   COVID-19 Vaccine (1) Never done   HIV Screening  Never done   Hepatitis C Screening  Never done   Zoster Vaccines- Shingrix (1 of 2) Never done   INFLUENZA VACCINE  Never done   COLONOSCOPY (Pts 45-57yrs  Insurance coverage will need to be confirmed)  03/07/2022 (Originally 11/16/2014)   TETANUS/TDAP  03/07/2022 (Originally 11/16/1988)   Pneumococcal Vaccine 40-75 Years old  Aged Out   HPV VACCINES  Aged Out     ----------------------------------------------------------------------------------------------------------------------------------------------------------------------------------------------------------------- Physical Exam BP (!) 130/91 (BP Location: Left Arm, Patient Position: Sitting, Cuff Size: Large)   Pulse 77   Temp 97.7 F (36.5 C)   Ht 6' (1.829 m)   Wt 221 lb (100.2 kg)   SpO2 99%   BMI 29.97 kg/m   Physical Exam Constitutional:      Appearance: Normal appearance.  HENT:     Ears:     Comments: Right EAC with some mild erythema as well as dry flaky skin.  No foreign bodies noted.  No excess wax noted. Eyes:     General: No scleral icterus. Cardiovascular:     Rate and Rhythm: Normal rate and regular rhythm.  Pulmonary:     Effort: Pulmonary effort is normal.     Breath sounds: Normal breath sounds.  Musculoskeletal:     Cervical back: Neck supple.  Neurological:     General: No focal deficit present.     Mental Status: He is alert.  Psychiatric:  Mood and Affect: Mood normal.        Behavior: Behavior normal.    ------------------------------------------------------------------------------------------------------------------------------------------------------------------------------------------------------------------- Assessment and Plan  Eczematoid otitis externa of right ear Starting Cortisporin drops.  Instructed to contact clinic if symptoms are not improving.   Meds ordered this encounter  Medications   neomycin-polymyxin-hydrocortisone (CORTISPORIN) OTIC solution    Sig: Place 4 drops into the right ear 4 (four) times daily for 7 days.    Dispense:  10 mL    Refill:  0    No follow-ups on file.    This visit occurred during the  SARS-CoV-2 public health emergency.  Safety protocols were in place, including screening questions prior to the visit, additional usage of staff PPE, and extensive cleaning of exam room while observing appropriate contact time as indicated for disinfecting solutions.

## 2021-11-23 ENCOUNTER — Encounter: Payer: Self-pay | Admitting: Family Medicine

## 2022-01-08 IMAGING — DX DG FOOT COMPLETE 3+V*R*
3 series · 3 of 3 positions shown · non-contrast
Comparison: None.

CLINICAL DATA: Right great toe pain for 2 years.

EXAM:
RIGHT FOOT COMPLETE - 3+ VIEW

[foot ap]
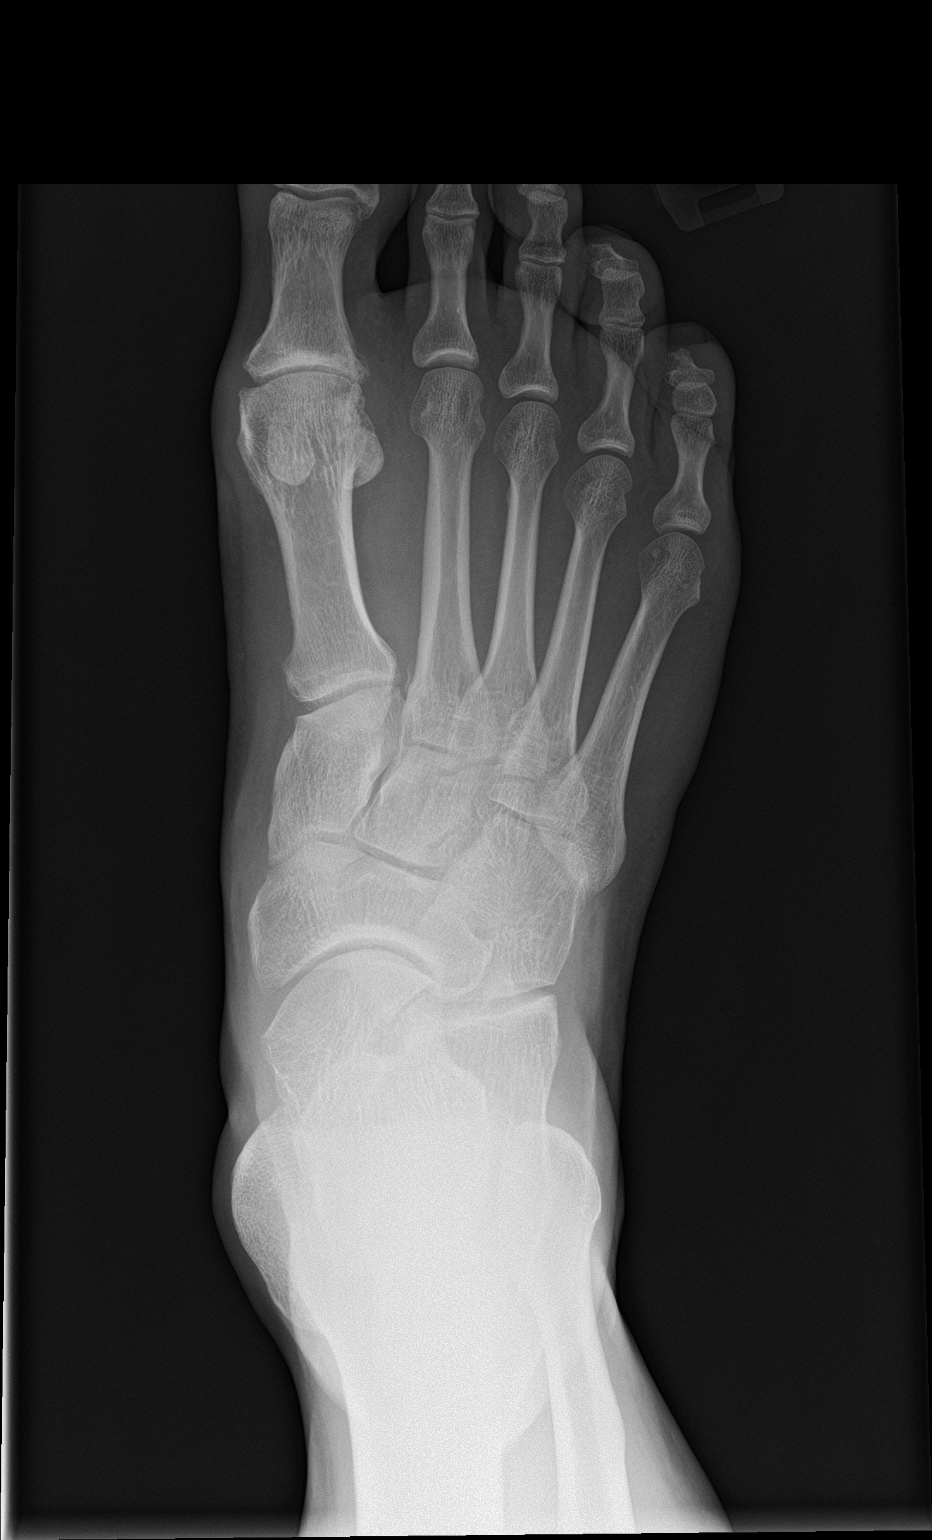

[foot obl]
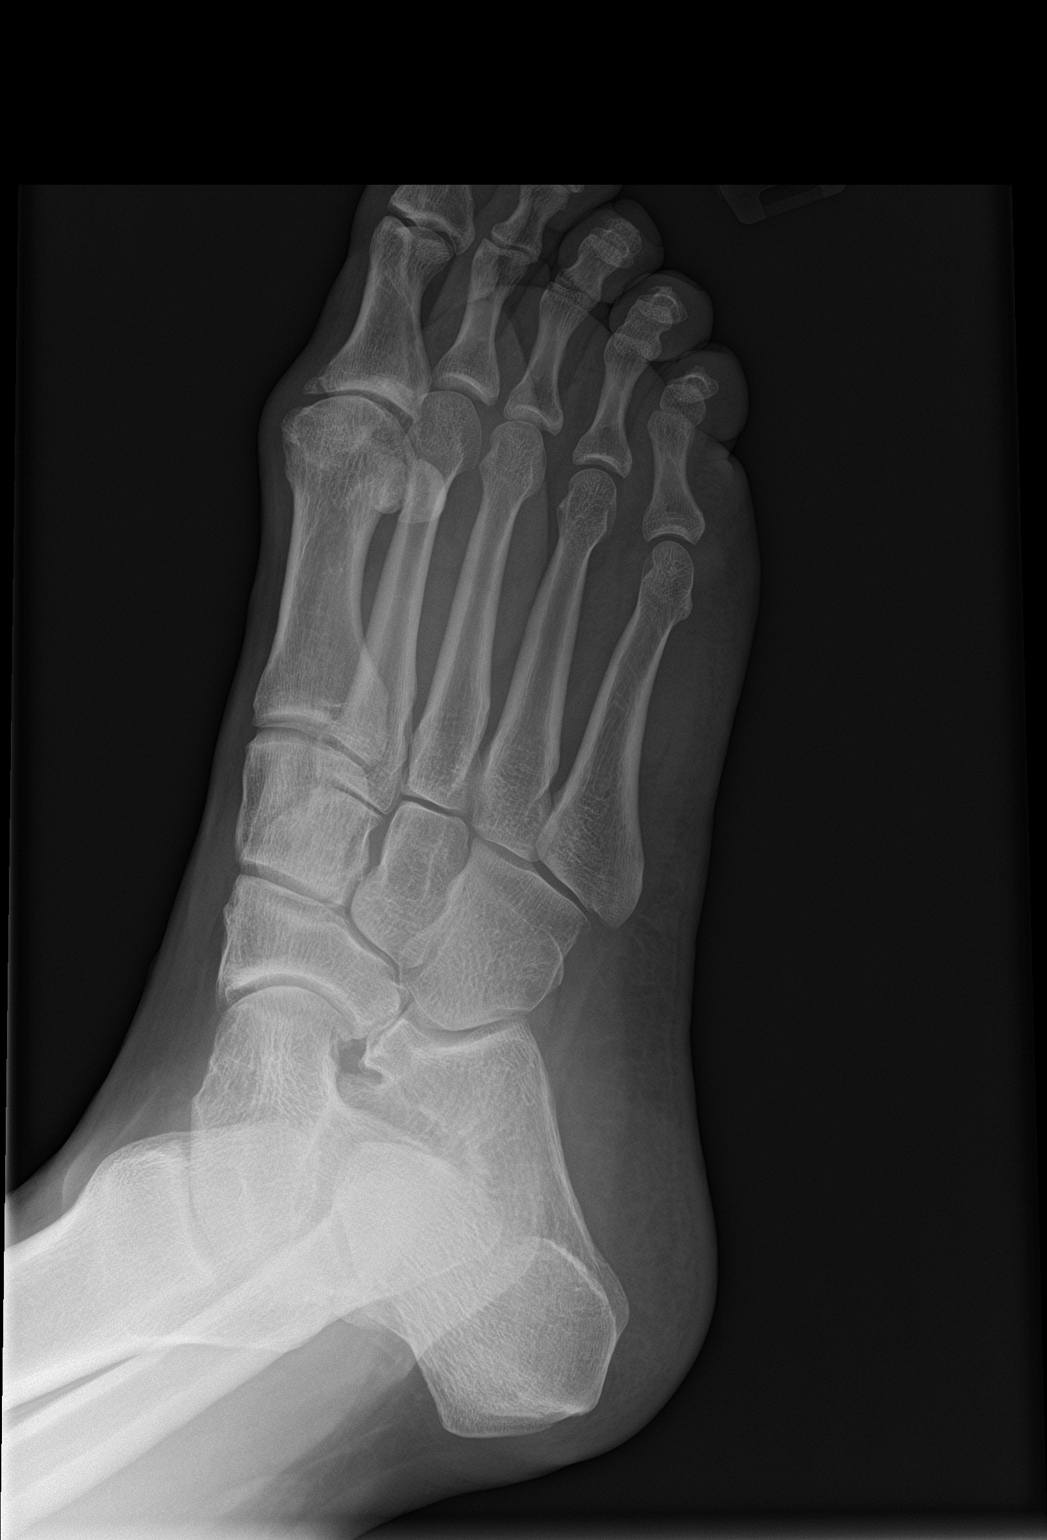

[foot lat]
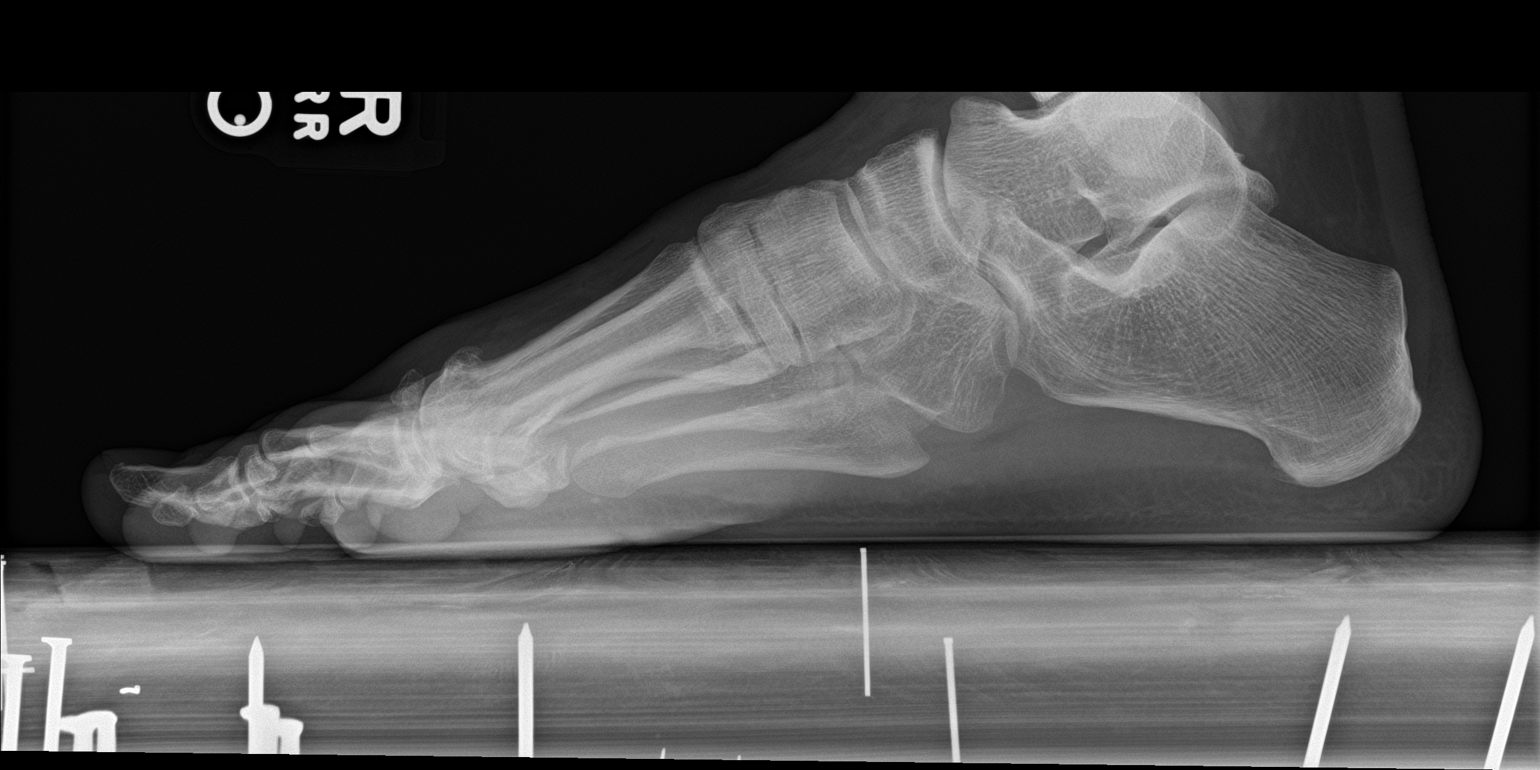

[3 of 3 positions shown; findings below may reference images not displayed]

FINDINGS: There is moderate osteoarthritis of the first MTP joint with joint
space narrowing and osteophyte formation. Bones and joints of the
remainder of the right foot appear normal.
IMPRESSION: Moderate osteoarthritis of the first MTP joint.

## 2022-02-26 ENCOUNTER — Ambulatory Visit: Payer: Managed Care, Other (non HMO) | Admitting: Physician Assistant

## 2022-03-08 ENCOUNTER — Ambulatory Visit: Payer: Managed Care, Other (non HMO) | Admitting: Family Medicine

## 2022-08-13 ENCOUNTER — Ambulatory Visit: Payer: Managed Care, Other (non HMO) | Admitting: Physician Assistant

## 2022-08-13 ENCOUNTER — Ambulatory Visit (INDEPENDENT_AMBULATORY_CARE_PROVIDER_SITE_OTHER): Payer: Managed Care, Other (non HMO)

## 2022-08-13 ENCOUNTER — Encounter: Payer: Self-pay | Admitting: Physician Assistant

## 2022-08-13 VITALS — BP 137/96 | HR 93 | Ht 72.0 in | Wt 228.0 lb

## 2022-08-13 DIAGNOSIS — N5089 Other specified disorders of the male genital organs: Secondary | ICD-10-CM | POA: Insufficient documentation

## 2022-08-13 DIAGNOSIS — R1031 Right lower quadrant pain: Secondary | ICD-10-CM | POA: Diagnosis not present

## 2022-08-13 DIAGNOSIS — N50811 Right testicular pain: Secondary | ICD-10-CM

## 2022-08-13 LAB — POCT URINALYSIS DIP (CLINITEK)
Bilirubin, UA: NEGATIVE
Blood, UA: NEGATIVE
Glucose, UA: NEGATIVE mg/dL
Ketones, POC UA: NEGATIVE mg/dL
Leukocytes, UA: NEGATIVE
Nitrite, UA: NEGATIVE
POC PROTEIN,UA: NEGATIVE
Spec Grav, UA: 1.025 (ref 1.010–1.025)
Urobilinogen, UA: 0.2 E.U./dL
pH, UA: 5.5 (ref 5.0–8.0)

## 2022-08-13 MED ORDER — HYDROCODONE-ACETAMINOPHEN 5-325 MG PO TABS: 1.0000 | ORAL_TABLET | Freq: Four times a day (QID) | ORAL | 0 refills | Status: DC | PRN

## 2022-08-13 MED ORDER — HYDROCODONE-ACETAMINOPHEN 5-325 MG PO TABS: 1.0000 | ORAL_TABLET | Freq: Four times a day (QID) | ORAL | 0 refills | Status: AC | PRN

## 2022-08-13 MED ORDER — LEVOFLOXACIN 500 MG PO TABS: 500.0000 mg | ORAL_TABLET | Freq: Every day | ORAL | 0 refills | Status: AC

## 2022-08-13 NOTE — Progress Notes (Unsigned)
Acute Office Visit  Subjective:     Patient ID: Dale Roberts, male    DOB: 08/12/1971, 51 y.o.   MRN: [id number]  Chief Complaint  Patient presents with   Abdominal Pain    HPI Patient is in today for right testicular pain that started about 1.5 weeks ago and has started to worsen. He would rate the pain today 9/10. He denies any redness, swelling, warmth in testicle. No injury that patient is aware of. Denies any problems with urination or penile discharge. He has had some constipation more recently. Denies any nausea or vomiting. No fever, chills, body aches. His pain does radiate from the testicle into his right lower abdomen. Movement of right testicle is what causes the most pain.  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 Nothing else was found. He was treated with doxycycline/ciprofloxacin and symptoms resolved.   .. 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      Objective:    BP (!) 137/96   Pulse  93   Ht 6' (1.829 m)   Wt 228 lb (103.4 kg)   SpO2 99%   BMI 30.92 kg/m  BP Readings from Last 3 Encounters:  08/13/22 (!) 137/96  11/21/21 (!) 130/91  03/07/21 (!) 124/96   Wt Readings from Last 3 Encounters:  08/13/22 228 lb (103.4 kg)  11/21/21 221 lb (100.2 kg)  03/07/21 228 lb (103.4 kg)      Physical Exam Constitutional:      Appearance: He is well-developed.  HENT:     Head: Normocephalic.  Abdominal:     General: Abdomen is flat. Bowel sounds are normal. There is no distension.     Palpations: Abdomen is soft. There is no mass.     Tenderness: There is abdominal tenderness in the right lower quadrant. There is no right CVA tenderness, left CVA tenderness, guarding or rebound. Negative signs include Murphy's sign, Rovsing's sign, McBurney's sign and obturator sign.     Hernia: No hernia is present.     Comments: Pulling in the right lower quadrant with psoas sign  Genitourinary:    Penis: Normal.      Testes:        Right: Tenderness present. Mass or swelling not present.        Left: Mass, tenderness or swelling not present.     Comments: No erythema or swelling of right testicle but tender to any palpation.  Neurological:  General: No focal deficit present.     Mental Status: He is alert.  Psychiatric:        Mood and Affect: Mood normal.          Assessment & Plan:  Marland KitchenMarland KitchenCopper was seen today for abdominal pain.  Diagnoses and all orders for this visit:  Pain in right testicle -     POCT URINALYSIS DIP (CLINITEK) -     Urine Culture -     Cancel: US SCROTUM W/DOPPLER -     Cancel: US SCROTUM -     US SCROTUM W/DOPPLER -     levofloxacin (LEVAQUIN) 500 MG tablet; Take 1 tablet (500 mg total) by mouth daily for 10 days. -     HYDROcodone-acetaminophen (NORCO/VICODIN) 5-325 MG tablet; Take 1 tablet by mouth every 6 (six) hours as needed for up to 5 days for moderate pain.  Testicular microlithiasis -     Cancel: US SCROTUM W/DOPPLER -     Cancel: US  SCROTUM -     US SCROTUM W/DOPPLER -     levofloxacin (LEVAQUIN) 500 MG tablet; Take 1 tablet (500 mg total) by mouth daily for 10 days. -     HYDROcodone-acetaminophen (NORCO/VICODIN) 5-325 MG tablet; Take 1 tablet by mouth every 6 (six) hours as needed for up to 5 days for moderate pain.  Right lower quadrant abdominal pain -     Cancel: US SCROTUM W/DOPPLER -     Cancel: US SCROTUM -     US SCROTUM W/DOPPLER -     levofloxacin (LEVAQUIN) 500 MG tablet; Take 1 tablet (500 mg total) by mouth daily for 10 days. -     HYDROcodone-acetaminophen (NORCO/VICODIN) 5-325 MG tablet; Take 1 tablet by mouth every 6 (six) hours as needed for up to 5 days for moderate pain.  Other orders -     Discontinue: HYDROcodone-acetaminophen (NORCO/VICODIN) 5-325 MG tablet; Take 1 tablet by mouth every 6 (six) hours as needed for up to 5 days for moderate pain.   Marland Kitchen.PDMP reviewed during this encounter. No concerns 9/10 pain sent norco for breakthrough Start ibuprofen 800mg  three times a day  Will get scrotal U/S to rule out torsion UA negative for any concerning findings for infection Will culture Follow up after u/s if pain/symptoms changing or worsening    , PA-C

## 2022-08-13 NOTE — Progress Notes (Signed)
There continues to be the tiny calcifications in the both testicles but have not worsened Left greater than right hydroceles No evidence of torsion No abnormal masses  No reason for pain I am going to empirically treat you with levaquin for 10 days and pain medication Continue to push fluids  If abdominal pain is worsening let me know and I will get CT of abdomen

## 2022-08-13 NOTE — Progress Notes (Signed)
Urinalysis showed no sign of infection.

## 2022-08-14 ENCOUNTER — Encounter: Payer: Self-pay | Admitting: Physician Assistant

## 2022-08-14 DIAGNOSIS — N50811 Right testicular pain: Secondary | ICD-10-CM

## 2022-08-14 DIAGNOSIS — N5089 Other specified disorders of the male genital organs: Secondary | ICD-10-CM

## 2022-08-14 DIAGNOSIS — M545 Low back pain, unspecified: Secondary | ICD-10-CM

## 2022-08-14 DIAGNOSIS — R1031 Right lower quadrant pain: Secondary | ICD-10-CM | POA: Insufficient documentation

## 2022-08-14 MED ORDER — IBUPROFEN 800 MG PO TABS: 800.0000 mg | ORAL_TABLET | Freq: Three times a day (TID) | ORAL | 0 refills | Status: DC | PRN

## 2022-08-15 ENCOUNTER — Encounter: Payer: Self-pay | Admitting: Physician Assistant

## 2022-08-15 ENCOUNTER — Ambulatory Visit (INDEPENDENT_AMBULATORY_CARE_PROVIDER_SITE_OTHER): Payer: Managed Care, Other (non HMO)

## 2022-08-15 ENCOUNTER — Other Ambulatory Visit: Payer: Managed Care, Other (non HMO)

## 2022-08-15 ENCOUNTER — Other Ambulatory Visit: Payer: Self-pay | Admitting: Physician Assistant

## 2022-08-15 DIAGNOSIS — R110 Nausea: Secondary | ICD-10-CM

## 2022-08-15 DIAGNOSIS — K5900 Constipation, unspecified: Secondary | ICD-10-CM

## 2022-08-15 DIAGNOSIS — R1031 Right lower quadrant pain: Secondary | ICD-10-CM

## 2022-08-15 DIAGNOSIS — N5089 Other specified disorders of the male genital organs: Secondary | ICD-10-CM

## 2022-08-15 DIAGNOSIS — I700 Atherosclerosis of aorta: Secondary | ICD-10-CM | POA: Insufficient documentation

## 2022-08-15 DIAGNOSIS — N50811 Right testicular pain: Secondary | ICD-10-CM

## 2022-08-15 LAB — URINE CULTURE
MICRO NUMBER:: 13845106
Result:: NO GROWTH
SPECIMEN QUALITY:: ADEQUATE

## 2022-08-15 NOTE — Telephone Encounter (Signed)
Urgent referral sent

## 2022-08-15 NOTE — Progress Notes (Signed)
Bob,   Lungs/gallbladder/liver/pancreas/spleen/kidney/bladder loook great  No findings in the appendix area or bowel wall thickening. No significant constipation.  No pelvic lymph nodes to suggest infection.   You have some aortic plaque and we could consider statin to help prevent any accumulation but not causing pain.   Small umbilical hernia unchanged.( Not causing pain)   You have a small LEFT inguinal hernia but not causing right sided pain.   No reason for right sided testicular pain.   We can do an urgent referral to urology.

## 2022-08-15 NOTE — Progress Notes (Signed)
No bacteria growth on urine culture.

## 2022-08-15 NOTE — Progress Notes (Signed)
Urgent referral made to urology.

## 2022-08-17 MED ORDER — PREDNISONE 50 MG PO TABS: ORAL_TABLET | ORAL | 0 refills | Status: DC

## 2022-08-17 NOTE — Addendum Note (Signed)
Addended by: Jomarie Longs on: 08/17/2022 01:24 PM   Modules accepted: Orders

## 2022-09-03 ENCOUNTER — Encounter: Payer: Managed Care, Other (non HMO) | Admitting: Family Medicine

## 2022-09-04 ENCOUNTER — Encounter: Payer: Managed Care, Other (non HMO) | Admitting: Family Medicine

## 2023-04-24 ENCOUNTER — Telehealth: Payer: Self-pay | Admitting: Physician Assistant

## 2023-04-24 NOTE — Telephone Encounter (Signed)
Patient called would like to know if you could see his mother as a need patient

## 2023-07-01 ENCOUNTER — Telehealth: Payer: Self-pay | Admitting: Physician Assistant

## 2023-07-01 NOTE — Telephone Encounter (Signed)
Patient called to ask if you would accept his Mother Dale Roberts as a new patient [phone]

## 2023-07-02 NOTE — Telephone Encounter (Signed)
Called son patient is scheduled for July 23rd.

## 2023-08-13 ENCOUNTER — Other Ambulatory Visit: Payer: Self-pay | Admitting: Physician Assistant

## 2023-08-13 DIAGNOSIS — N50811 Right testicular pain: Secondary | ICD-10-CM

## 2023-08-13 DIAGNOSIS — R1031 Right lower quadrant pain: Secondary | ICD-10-CM

## 2023-08-13 DIAGNOSIS — M545 Low back pain, unspecified: Secondary | ICD-10-CM

## 2023-08-13 DIAGNOSIS — N5089 Other specified disorders of the male genital organs: Secondary | ICD-10-CM

## 2023-09-11 ENCOUNTER — Encounter: Payer: Managed Care, Other (non HMO) | Admitting: Physician Assistant

## 2023-09-16 ENCOUNTER — Ambulatory Visit (INDEPENDENT_AMBULATORY_CARE_PROVIDER_SITE_OTHER): Payer: Managed Care, Other (non HMO) | Admitting: Physician Assistant

## 2023-09-16 ENCOUNTER — Encounter: Payer: Self-pay | Admitting: Physician Assistant

## 2023-09-16 VITALS — BP 137/86 | HR 74 | Ht 72.0 in | Wt 227.0 lb

## 2023-09-16 DIAGNOSIS — I700 Atherosclerosis of aorta: Secondary | ICD-10-CM | POA: Diagnosis not present

## 2023-09-16 DIAGNOSIS — E785 Hyperlipidemia, unspecified: Secondary | ICD-10-CM | POA: Diagnosis not present

## 2023-09-16 DIAGNOSIS — Z131 Encounter for screening for diabetes mellitus: Secondary | ICD-10-CM

## 2023-09-16 DIAGNOSIS — Z1211 Encounter for screening for malignant neoplasm of colon: Secondary | ICD-10-CM

## 2023-09-16 DIAGNOSIS — Z0000 Encounter for general adult medical examination without abnormal findings: Secondary | ICD-10-CM

## 2023-09-16 DIAGNOSIS — Z125 Encounter for screening for malignant neoplasm of prostate: Secondary | ICD-10-CM

## 2023-09-16 NOTE — Progress Notes (Unsigned)
   Complete physical exam  Patient: Dale Roberts   DOB: [birth year]   52 y.o. Male  MRN: [id number]  Subjective:    No chief complaint on file.   Dale Roberts is a 52 y.o. male who presents today for a complete physical exam. He reports consuming a general diet. Gym/ health club routine includes basketball and participating in ju-jitsu.Patient also states that he works an active job. He generally feels well. He reports sleeping fairly well. He does not have additional problems to discuss today.    Most recent fall risk assessment:    03/07/2021    9:17 AM  Fall Risk   Falls in the past year? 0  Number falls in past yr: 0  Injury with Fall? 0  Follow up Falls evaluation completed     Most recent depression screenings:    03/07/2021    9:16 AM 02/29/2020    3:23 PM  PHQ 2/9 Scores  PHQ - 2 Score 0 3  PHQ- 9 Score  10    PSA: Agrees to PSA testing  {History (Optional):23778}  Patient Care Team: Nolene Ebbs as PCP - General (Family Medicine)   Outpatient Medications Prior to Visit  Medication Sig   ibuprofen (ADVIL) 800 MG tablet Take 1 tablet (800 mg total) by mouth every 8 (eight) hours as needed.   predniSONE (DELTASONE) 50 MG tablet One tab PO daily for 5 days.   No facility-administered medications prior to visit.    ROS        Objective:     BP 137/86   Pulse 74   Ht 6' (1.829 m)   Wt 227 lb (103 kg)   SpO2 97%   BMI 30.79 kg/m  BP Readings from Last 3 Encounters:  09/16/23 137/86  08/13/22 (!) 137/96  11/21/21 (!) 130/91   Wt Readings from Last 3 Encounters:  09/16/23 227 lb (103 kg)  08/13/22 228 lb (103.4 kg)  11/21/21 221 lb (100.2 kg)      Physical Exam       Assessment & Plan:    Routine Health Maintenance and Physical Exam   There is no immunization history on file for this patient.  Health Maintenance  Topic Date Due   COVID-19 Vaccine (1 - 2023-24 season) 10/02/2023 (Originally 08/18/2023)   Zoster  Vaccines- Shingrix (1 of 2) 12/16/2023 (Originally 11/17/2019)   INFLUENZA VACCINE  03/16/2024 (Originally 07/18/2023)   DTaP/Tdap/Td (1 - Tdap) 09/02/2024 (Originally 11/16/1988)   Colonoscopy  09/15/2024 (Originally 11/16/2014)   Hepatitis C Screening  09/15/2024 (Originally 11/17/1987)   HIV Screening  09/15/2024 (Originally 11/16/1984)   HPV VACCINES  Aged Out    Discussed health benefits of physical activity, and encouraged him to engage in regular exercise appropriate for his age and condition.  Problem List Items Addressed This Visit   None   Patient denied flu vaccine today Labs ordered Patient agreed to colonoscopy, order placed Importance of continued healthy balanced diet and exercise discussed  No follow-ups on file.     Tandy Gaw, PA-C

## 2023-09-16 NOTE — Patient Instructions (Signed)
Health Maintenance, Male Adopting a healthy lifestyle and getting preventive care are important in promoting health and wellness. Ask your health care provider about: The right schedule for you to have regular tests and exams. Things you can do on your own to prevent diseases and keep yourself healthy. What should I know about diet, weight, and exercise? Eat a healthy diet  Eat a diet that includes plenty of vegetables, fruits, low-fat dairy products, and lean protein. Do not eat a lot of foods that are high in solid fats, added sugars, or sodium. Maintain a healthy weight Body mass index (BMI) is a measurement that can be used to identify possible weight problems. It estimates body fat based on height and weight. Your health care provider can help determine your BMI and help you achieve or maintain a healthy weight. Get regular exercise Get regular exercise. This is one of the most important things you can do for your health. Most adults should: Exercise for at least 150 minutes each week. The exercise should increase your heart rate and make you sweat (moderate-intensity exercise). Do strengthening exercises at least twice a week. This is in addition to the moderate-intensity exercise. Spend less time sitting. Even light physical activity can be beneficial. Watch cholesterol and blood lipids Have your blood tested for lipids and cholesterol at 54 years of age, then have this test every 5 years. You may need to have your cholesterol levels checked more often if: Your lipid or cholesterol levels are high. You are older than 54 years of age. You are at high risk for heart disease. What should I know about cancer screening? Many types of cancers can be detected early and may often be prevented. Depending on your health history and family history, you may need to have cancer screening at various ages. This may include screening for: Colorectal cancer. Prostate cancer. Skin cancer. Lung  cancer. What should I know about heart disease, diabetes, and high blood pressure? Blood pressure and heart disease High blood pressure causes heart disease and increases the risk of stroke. This is more likely to develop in people who have high blood pressure readings or are overweight. Talk with your health care provider about your target blood pressure readings. Have your blood pressure checked: Every 3-5 years if you are 18-39 years of age. Every year if you are 40 years old or older. If you are between the ages of 65 and 75 and are a current or former smoker, ask your health care provider if you should have a one-time screening for abdominal aortic aneurysm (AAA). Diabetes Have regular diabetes screenings. This checks your fasting blood sugar level. Have the screening done: Once every three years after age 45 if you are at a normal weight and have a low risk for diabetes. More often and at a younger age if you are overweight or have a high risk for diabetes. What should I know about preventing infection? Hepatitis B If you have a higher risk for hepatitis B, you should be screened for this virus. Talk with your health care provider to find out if you are at risk for hepatitis B infection. Hepatitis C Blood testing is recommended for: Everyone born from 1945 through 1965. Anyone with known risk factors for hepatitis C. Sexually transmitted infections (STIs) You should be screened each year for STIs, including gonorrhea and chlamydia, if: You are sexually active and are younger than 54 years of age. You are older than 54 years of age and your   health care provider tells you that you are at risk for this type of infection. Your sexual activity has changed since you were last screened, and you are at increased risk for chlamydia or gonorrhea. Ask your health care provider if you are at risk. Ask your health care provider about whether you are at high risk for HIV. Your health care provider  may recommend a prescription medicine to help prevent HIV infection. If you choose to take medicine to prevent HIV, you should first get tested for HIV. You should then be tested every 3 months for as long as you are taking the medicine. Follow these instructions at home: Alcohol use Do not drink alcohol if your health care provider tells you not to drink. If you drink alcohol: Limit how much you have to 0-2 drinks a day. Know how much alcohol is in your drink. In the [place] one drink equals one 12 oz bottle of beer (355 mL), one 5 oz glass of wine (148 mL), or one 1 oz glass of hard liquor (44 mL). Lifestyle Do not use any products that contain nicotine or tobacco. These products include cigarettes, chewing tobacco, and vaping devices, such as e-cigarettes. If you need help quitting, ask your health care provider. Do not use street drugs. Do not share needles. Ask your health care provider for help if you need support or information about quitting drugs. General instructions Schedule regular health, dental, and eye exams. Stay current with your vaccines. Tell your health care provider if: You often feel depressed. You have ever been abused or do not feel safe at home. Summary Adopting a healthy lifestyle and getting preventive care are important in promoting health and wellness. Follow your health care provider's instructions about healthy diet, exercising, and getting tested or screened for diseases. Follow your health care provider's instructions on monitoring your cholesterol and blood pressure. This information is not intended to replace advice given to you by your health care provider. Make sure you discuss any questions you have with your health care provider. Document Revised: 04/24/2021 Document Reviewed: 04/24/2021 Elsevier Patient Education  2024 [employer]  

## 2023-09-17 ENCOUNTER — Encounter: Payer: Self-pay | Admitting: Physician Assistant

## 2023-10-22 ENCOUNTER — Encounter: Payer: Self-pay | Admitting: Physician Assistant

## 2023-11-28 ENCOUNTER — Encounter: Payer: Self-pay | Admitting: Physician Assistant

## 2024-01-04 ENCOUNTER — Ambulatory Visit
Admission: EM | Admit: 2024-01-04 | Discharge: 2024-01-04 | Disposition: A | Discharge status: Home / Self Care | Payer: Managed Care, Other (non HMO) | Attending: Family Medicine | Admitting: Family Medicine

## 2024-01-04 ENCOUNTER — Encounter: Payer: Self-pay | Admitting: Emergency Medicine

## 2024-01-04 DIAGNOSIS — J209 Acute bronchitis, unspecified: Secondary | ICD-10-CM | POA: Diagnosis not present

## 2024-01-04 DIAGNOSIS — R030 Elevated blood-pressure reading, without diagnosis of hypertension: Secondary | ICD-10-CM | POA: Diagnosis not present

## 2024-01-04 MED ORDER — BENZONATATE 200 MG PO CAPS: 200.0000 mg | ORAL_CAPSULE | Freq: Three times a day (TID) | ORAL | 0 refills | Status: DC | PRN

## 2024-01-04 MED ORDER — PREDNISONE 50 MG PO TABS: ORAL_TABLET | ORAL | 0 refills | Status: DC

## 2024-01-04 MED ORDER — DOXYCYCLINE HYCLATE 100 MG PO CAPS: 100.0000 mg | ORAL_CAPSULE | Freq: Two times a day (BID) | ORAL | 0 refills | Status: DC

## 2024-01-04 NOTE — ED Provider Notes (Signed)
[facility]    CSN: [id number] Arrival date & time: 01/04/24  1341      History   Chief Complaint Chief Complaint  Patient presents with   Cough    HPI Dale Roberts is a 53 y.o. male.   Patient states he always gets bronchitis in the winter.  He currently has been coughing for over 2 weeks.  He is coughing up yellow sputum.  Is making his chest sore with coughing.  He is tired.  He does not sleep well at night.  He is not having any runny nose or sore throat or headache.    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   Eczematoid otitis externa of right ear 11/21/2021   Acute midline low back pain with bilateral sciatica 03/07/2021   Capsulitis of toe of right foot 03/07/2021   DDD (degenerative disc disease), lumbar 03/07/2021   Trouble in sleeping 03/01/2020   No energy 03/01/2020   Great toe pain, right 03/01/2020   Ingrown left greater toenail 05/08/2019   Erectile dysfunction 05/08/2019   Hyperlipidemia with target LDL less than 130 05/08/2019   Testicular pain 10/10/2012   Epididymitis, right 10/10/2012   MRSA INFECTION 02/27/2008    Past Surgical History:  Procedure Laterality Date   KNEE ARTHROSCOPY  2005   LUNG SURGERY  2006   MRSA removed       Home Medications    Prior to Admission medications   Medication Sig Start Date End Date Taking? Authorizing Provider  benzonatate (TESSALON) 200 MG capsule Take 1 capsule (200 mg total) by mouth 3 (three) times daily as needed for cough. 01/04/24  Yes Eustace Moore, MD  doxycycline (VIBRAMYCIN) 100 MG capsule Take 1 capsule (100 mg total) by mouth 2 (two) times daily. 01/04/24  Yes Eustace Moore, MD  predniSONE (DELTASONE) 50 MG tablet Take once a day for 5  days.  Take with food 01/04/24  Yes Eustace Moore, MD  ibuprofen (ADVIL) 800 MG tablet Take 1 tablet (800 mg total) by mouth every 8 (eight) hours as needed. 08/14/22   Jomarie Longs, PA-C    Family History Family History  Problem Relation Age of Onset   Heart Problems Mother     Social History Social History   Tobacco Use   Smoking status: Never   Smokeless tobacco: Never  Vaping Use   Vaping status: Never Used  Substance Use Topics   Alcohol use: Yes    Alcohol/week: 2.0 standard drinks of alcohol    Types: 2 Standard drinks or equivalent per week   Drug use: No     Allergies   Patient has no known allergies.   Review of Systems Review of Systems See HPI  Physical Exam Triage Vital Signs ED Triage Vitals  Encounter Vitals Group     BP 01/04/24 1436 (!) 161/100     Systolic BP Percentile --      Diastolic BP Percentile --      Pulse Rate 01/04/24 1436 74     Resp 01/04/24 1436 18     Temp 01/04/24 1436 98.2 F (36.8 C)     Temp Source 01/04/24 1436 Oral     SpO2 01/04/24 1436  98 %     Weight 01/04/24 1438 220 lb (99.8 kg)     Height 01/04/24 1438 6' (1.829 m)     Head Circumference --      Peak Flow --      Pain Score 01/04/24 1437 9     Pain Loc --      Pain Education --      Exclude from Growth Chart --    No data found.  Updated Vital Signs BP (!) 161/100 (BP Location: Right Arm)   Pulse 74   Temp 98.2 F (36.8 C) (Oral)   Resp 18   Ht 6' (1.829 m)   Wt 99.8 kg   SpO2 98%   BMI 29.84 kg/m      Physical Exam Constitutional:      General: He is not in acute distress.    Appearance: He is well-developed and normal weight.  HENT:     Head: Normocephalic and atraumatic.     Right Ear: Tympanic membrane normal.     Left Ear: Tympanic membrane normal.     Nose: Nose normal. No rhinorrhea.     Mouth/Throat:     Mouth: Mucous membranes are moist.     Pharynx: No posterior oropharyngeal erythema.  Eyes:     Conjunctiva/sclera:  Conjunctivae normal.     Pupils: Pupils are equal, round, and reactive to light.  Cardiovascular:     Rate and Rhythm: Normal rate.     Heart sounds: Normal heart sounds.  Pulmonary:     Effort: Pulmonary effort is normal. No respiratory distress.     Breath sounds: Normal breath sounds.  Abdominal:     General: There is no distension.     Palpations: Abdomen is soft.  Musculoskeletal:        General: Normal range of motion.     Cervical back: Normal range of motion.  Skin:    General: Skin is warm and dry.  Neurological:     Mental Status: He is alert.      UC Treatments / Results  Labs (all labs ordered are listed, but only abnormal results are displayed) Labs Reviewed - No data to display  EKG   Radiology No results found.  Procedures Procedures (including critical care time)  Medications Ordered in UC Medications - No data to display  Initial Impression / Assessment and Plan / UC Course  I have reviewed the triage vital signs and the nursing notes.  Pertinent labs & imaging results that were available during my care of the patient were reviewed by me and considered in my medical decision making (see chart for details).     Final Clinical Impressions(s) / UC Diagnoses   Final diagnoses:  Acute bronchitis, unspecified organism  Elevated blood pressure reading     Discharge Instructions      Continue to drink lots of fluids Take the doxycycline 2 times a day.  Take this medicine with food Take prednisone once a day with the doxycycline May take Tessalon 2-3 times a day for cough See your doctor if not improving by next week  Your blood pressure is elevated today.  This needs to be a rechecked when you are feeling better   ED Prescriptions     Medication Sig Dispense Auth. Provider   doxycycline (VIBRAMYCIN) 100 MG capsule Take 1 capsule (100 mg total) by mouth 2 (two) times daily. 14 capsule Eustace Moore, MD   predniSONE (DELTASONE) 50 MG  tablet Take  once a day for 5 days.  Take with food 5 tablet Eustace Moore, MD   benzonatate (TESSALON) 200 MG capsule Take 1 capsule (200 mg total) by mouth 3 (three) times daily as needed for cough. 21 capsule Eustace Moore, MD      PDMP not reviewed this encounter.   Eustace Moore, MD 01/04/24 [phone]

## 2024-01-04 NOTE — ED Triage Notes (Signed)
Patient c/o cough, nasal drainage, chest congestion, sore throat x 2 weeks.  Patient has taken Nyquil for sx's.

## 2024-01-04 NOTE — Discharge Instructions (Signed)
Continue to drink lots of fluids Take the doxycycline 2 times a day.  Take this medicine with food Take prednisone once a day with the doxycycline May take Tessalon 2-3 times a day for cough See your doctor if not improving by next week  Your blood pressure is elevated today.  This needs to be a rechecked when you are feeling better

## 2024-01-21 ENCOUNTER — Telehealth: Payer: Self-pay

## 2024-01-21 NOTE — Telephone Encounter (Signed)
Pt showed up in ofc requesting work note from last UC visit. Work note provided.

## 2024-04-06 ENCOUNTER — Telehealth: Payer: Self-pay | Admitting: Physician Assistant

## 2024-04-06 ENCOUNTER — Ambulatory Visit (INDEPENDENT_AMBULATORY_CARE_PROVIDER_SITE_OTHER): Admitting: Physician Assistant

## 2024-04-06 VITALS — BP 125/86 | HR 72 | Temp 98.3°F | Ht 72.0 in | Wt 222.0 lb

## 2024-04-06 DIAGNOSIS — J40 Bronchitis, not specified as acute or chronic: Secondary | ICD-10-CM | POA: Diagnosis not present

## 2024-04-06 DIAGNOSIS — J329 Chronic sinusitis, unspecified: Secondary | ICD-10-CM

## 2024-04-06 MED ORDER — PREDNISONE 50 MG PO TABS: ORAL_TABLET | ORAL | 0 refills | Status: AC

## 2024-04-06 MED ORDER — AZITHROMYCIN 250 MG PO TABS
ORAL_TABLET | ORAL | 0 refills | Status: AC
Start: 2024-04-06 — End: ?

## 2024-04-06 MED ORDER — ALBUTEROL SULFATE HFA 108 (90 BASE) MCG/ACT IN AERS: 2.0000 | INHALATION_SPRAY | Freq: Four times a day (QID) | RESPIRATORY_TRACT | 0 refills | Status: AC | PRN

## 2024-04-06 NOTE — Patient Instructions (Signed)

## 2024-04-06 NOTE — Telephone Encounter (Signed)
 Copay verified via online web portal   PCP $25.00 Specialist $50.00  Pt normally pays $25 but was charged $50 DOS 04/06/24.  Copay refunded back to patient via visa card.

## 2024-04-06 NOTE — Telephone Encounter (Signed)
 Copied from CRM [id number]. Topic: General - Other >> Apr 06, 2024 10:26 AM Karole Pacer C wrote: Reason for CRM: Patient states he was charged a $50.00 copay opposed to his normal $25 he typically pays. Patient would like a call back at [phone] to discuss the reason for the change.

## 2024-04-07 ENCOUNTER — Encounter: Payer: Self-pay | Admitting: Physician Assistant

## 2024-04-07 NOTE — Progress Notes (Signed)
 Acute Office Visit  Subjective:     Patient ID: Dale Roberts, male    DOB: November 11, 1971, 52 y.o.   MRN: [id number]  Chief Complaint  Patient presents with   Cough     Broncial head and chest infection x1 week    HPI Patient is in today for cough, congestion, head pressure, sinus drainage for the last week. He is not or has never been a smoker but tends to get bronchitis more frequently than he would like. Denies any fever, chills, body aches. He is taking OTC cold and cough medications with not much improvement. 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 No hx of asthma. His chest does feel tight.   .. 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   Right lower quadrant abdominal pain 08/14/2022   Aortic atherosclerosis (HCC) 08/15/2022   Resolved Ambulatory Problems    Diagnosis Date Noted   Infectious diarrhea 12/05/2007   Carbuncle and furuncle 02/27/2008   Medial epicondylitis, left 06/25/2017   Past Medical History:  Diagnosis Date   Anxiety    Sleep concern    Stress headaches        Objective:    BP 125/86   Pulse 72   Temp 98.3 F (36.8 C) (Oral)   Ht 6' (1.829 m)   Wt 222 lb (100.7 kg)   SpO2 100%   BMI 30.11 kg/m  BP Readings from Last 3 Encounters:  04/06/24 125/86  01/04/24 130/87  09/16/23 137/86   Wt Readings from Last 3 Encounters:  04/06/24 222 lb (100.7  kg)  01/04/24 220 lb (99.8 kg)  09/16/23 227 lb (103 kg)      Physical Exam Constitutional:      Appearance: Normal appearance.  HENT:     Head: Normocephalic.     Right Ear: Tympanic membrane, ear canal and external ear normal. There is no impacted cerumen.     Left Ear: Tympanic membrane, ear canal and external ear normal. There is no impacted cerumen.     Nose: Congestion and rhinorrhea present.     Comments: Swollen turbinates with erythema.     Mouth/Throat:     Mouth: Mucous membranes are moist.     Pharynx: Posterior oropharyngeal erythema present. No oropharyngeal exudate.  Eyes:     Conjunctiva/sclera: Conjunctivae normal.  Cardiovascular:     Rate and Rhythm: Normal rate and regular rhythm.  Pulmonary:     Effort: Pulmonary effort is normal.     Breath sounds: Normal breath sounds.  Musculoskeletal:     Cervical back: Normal range of motion and neck supple. No tenderness.  Lymphadenopathy:     Cervical: No cervical adenopathy.  Neurological:     Mental Status: He is alert.          Assessment & Plan:  [name] was seen today for cough.  Diagnoses and all orders for  this visit:  Sinobronchitis -     predniSONE  (DELTASONE ) 50 MG tablet; Take once a day for 5 days.  Take with food -     azithromycin  (ZITHROMAX  Z-PAK) 250 MG tablet; Take 2 tablets (500 mg) on  Day 1,  followed by 1 tablet (250 mg) once daily on Days 2 through 5. -     albuterol  (VENTOLIN  HFA) 108 (90 Base) MCG/ACT inhaler; Inhale 2 puffs into the lungs every 6 (six) hours as needed.   Treated for sinobronchitis with zpak, prednisone , albuterol  inhaler Consider daily allergy medication like zyrtec Pt worried about chronic bronchitis Once he is feeling better could consider spirometry in office to look at lung function  Follow up as needed if symptoms persist or worsen.      Return if symptoms worsen or fail to improve.  [name], PA-C

## 2024-07-08 ENCOUNTER — Ambulatory Visit: Admitting: Physician Assistant

## 2024-07-09 ENCOUNTER — Encounter: Payer: Self-pay | Admitting: Physician Assistant

## 2024-07-27 ENCOUNTER — Ambulatory Visit: Admitting: Physician Assistant

## 2024-09-16 ENCOUNTER — Encounter: Admitting: Physician Assistant

## 2024-09-30 ENCOUNTER — Telehealth: Payer: Self-pay

## 2024-09-30 NOTE — Telephone Encounter (Signed)
 Dale Roberts declined colonoscopy or Cologuard.
